# Patient Record
Sex: Male | Born: 1935
Health system: Southern US, Community
[De-identification: ages and names within clinical notes are randomized; demographics above are authoritative.]

## PROBLEM LIST (undated history)

## (undated) DIAGNOSIS — L409 Psoriasis, unspecified: Secondary | ICD-10-CM

## (undated) DIAGNOSIS — Z860100 Personal history of colon polyps, unspecified: Secondary | ICD-10-CM

## (undated) DIAGNOSIS — Z972 Presence of dental prosthetic device (complete) (partial): Secondary | ICD-10-CM

## (undated) DIAGNOSIS — M1990 Unspecified osteoarthritis, unspecified site: Secondary | ICD-10-CM

## (undated) DIAGNOSIS — E785 Hyperlipidemia, unspecified: Secondary | ICD-10-CM

## (undated) DIAGNOSIS — K5792 Diverticulitis of intestine, part unspecified, without perforation or abscess without bleeding: Secondary | ICD-10-CM

## (undated) HISTORY — DX: Diverticulitis of intestine, part unspecified, without perforation or abscess without bleeding: K57.92

## (undated) HISTORY — DX: Personal history of colonic polyps: Z86.010

## (undated) HISTORY — DX: Hyperlipidemia, unspecified: E78.5

## (undated) HISTORY — PX: SHOULDER SURGERY: SHX246

## (undated) HISTORY — PX: OTHER SURGICAL HISTORY: SHX169

## (undated) HISTORY — DX: Unspecified osteoarthritis, unspecified site: M19.90

## (undated) HISTORY — DX: Personal history of colon polyps, unspecified: Z86.0100

---

## 2016-05-12 ENCOUNTER — Ambulatory Visit: Payer: Medicare Other | Admitting: Internal Medicine

## 2016-05-26 ENCOUNTER — Ambulatory Visit (INDEPENDENT_AMBULATORY_CARE_PROVIDER_SITE_OTHER): Payer: Medicare Other | Admitting: Internal Medicine

## 2016-05-26 ENCOUNTER — Encounter: Payer: Self-pay | Admitting: Internal Medicine

## 2016-05-26 VITALS — BP 124/70 | HR 62 | Temp 97.4°F | Wt 191.8 lb

## 2016-05-26 DIAGNOSIS — K5730 Diverticulosis of large intestine without perforation or abscess without bleeding: Secondary | ICD-10-CM | POA: Diagnosis not present

## 2016-05-26 DIAGNOSIS — M1990 Unspecified osteoarthritis, unspecified site: Secondary | ICD-10-CM

## 2016-05-26 DIAGNOSIS — E7800 Pure hypercholesterolemia, unspecified: Secondary | ICD-10-CM | POA: Diagnosis not present

## 2016-05-26 DIAGNOSIS — L409 Psoriasis, unspecified: Secondary | ICD-10-CM | POA: Diagnosis not present

## 2016-05-26 NOTE — Assessment & Plan Note (Signed)
-  Continue Pravastatin ?

## 2016-05-26 NOTE — Assessment & Plan Note (Signed)
Continue Claritin and [id number]

## 2016-05-26 NOTE — Patient Instructions (Signed)
Fat and Cholesterol Restricted Diet Introduction Getting too much fat and cholesterol in your diet may cause health problems. Following this diet helps keep your fat and cholesterol at normal levels. This can keep you from getting sick. What types of fat should I choose?  Choose monosaturated and polyunsaturated fats. These are found in foods such as olive oil, canola oil, flaxseeds, walnuts, almonds, and seeds.  Eat more omega-3 fats. Good choices include salmon, mackerel, sardines, tuna, flaxseed oil, and ground flaxseeds.  Limit saturated fats. These are in animal products such as meats, butter, and cream. They can also be in plant products such as palm oil, palm kernel oil, and coconut oil.  Avoid foods with partially hydrogenated oils in them. These contain trans fats. Examples of foods that have trans fats are stick margarine, some tub margarines, cookies, crackers, and other baked goods. What general guidelines do I need to follow?  Check food labels. Look for the words "trans fat" and "saturated fat."  When preparing a meal:  Fill half of your plate with vegetables and green salads.  Fill one fourth of your plate with whole grains. Look for the word "whole" as the first word in the ingredient list.  Fill one fourth of your plate with lean protein foods.  Eat more foods that have fiber, like apples, carrots, beans, peas, and barley.  Eat more home-cooked foods. Eat less at restaurants and buffets.  Limit or avoid alcohol.  Limit foods high in starch and sugar.  Limit fried foods.  Cook foods without frying them. Baking, boiling, grilling, and broiling are all great options.  Lose weight if you are overweight. Losing even a small amount of weight can help your overall health. It can also help prevent diseases such as diabetes and heart disease. What foods can I eat? Grains  Whole grains, such as whole wheat or whole grain breads, crackers, cereals, and pasta. Unsweetened  oatmeal, bulgur, barley, quinoa, or brown rice. Corn or whole wheat flour tortillas. Vegetables  Fresh or frozen vegetables (raw, steamed, roasted, or grilled). Green salads. Fruits  All fresh, canned (in natural juice), or frozen fruits. Meat and Other Protein Products  Ground beef (85% or leaner), grass-fed beef, or beef trimmed of fat. Skinless chicken or turkey. Ground chicken or turkey. Pork trimmed of fat. All fish and seafood. Eggs. Dried beans, peas, or lentils. Unsalted nuts or seeds. Unsalted canned or dry beans. Dairy  Low-fat dairy products, such as skim or 1% milk, 2% or reduced-fat cheeses, low-fat ricotta or cottage cheese, or plain low-fat yogurt. Fats and Oils  Tub margarines without trans fats. Light or reduced-fat mayonnaise and salad dressings. Avocado. Olive, canola, sesame, or safflower oils. Natural peanut or almond butter (choose ones without added sugar and oil). The items listed above may not be a complete list of recommended foods or beverages. Contact your dietitian for more options.  What foods are not recommended? Grains  White bread. White pasta. White rice. Cornbread. Bagels, pastries, and croissants. Crackers that contain trans fat. Vegetables  White potatoes. Corn. Creamed or fried vegetables. Vegetables in a cheese sauce. Fruits  Dried fruits. Canned fruit in light or heavy syrup. Fruit juice. Meat and Other Protein Products  Fatty cuts of meat. Ribs, chicken wings, bacon, sausage, bologna, salami, chitterlings, fatback, hot dogs, bratwurst, and packaged luncheon meats. Liver and organ meats. Dairy  Whole or 2% milk, cream, half-and-half, and cream cheese. Whole milk cheeses. Whole-fat or sweetened yogurt. Full-fat cheeses. Nondairy creamers and   whipped toppings. Processed cheese, cheese spreads, or cheese curds. Sweets and Desserts  Corn syrup, sugars, honey, and molasses. Candy. Jam and jelly. Syrup. Sweetened cereals. Cookies, pies, cakes, donuts,  muffins, and ice cream. Fats and Oils  Butter, stick margarine, lard, shortening, ghee, or bacon fat. Coconut, palm kernel, or palm oils. Beverages  Alcohol. Sweetened drinks (such as sodas, lemonade, and fruit drinks or punches). The items listed above may not be a complete list of foods and beverages to avoid. Contact your dietitian for more information.  This information is not intended to replace advice given to you by your health care provider. Make sure you discuss any questions you have with your health care provider. Document Released: 09/29/2011 Document Revised: 12/05/2015 Document Reviewed: 06/29/2013  2017 Elsevier  

## 2016-05-26 NOTE — Assessment & Plan Note (Signed)
Advised him it is okay to take Aleve daily as needed Monitor for now

## 2016-05-26 NOTE — Assessment & Plan Note (Signed)
S/p resection Will monitor

## 2016-05-26 NOTE — Progress Notes (Signed)
HPI  Pt presents to the clinic today to establish care and for management of the conditions listed below.   HLD: He reports he just had his labs drawn 6 weeks ago. He is taking Pravastatin as prescribed. He denies myalgias. He does try to eat a low fat diet.  Arthritis: Mainly in his shoulders and low back pain. He takes Ibuprofen as needed with good relief.  Diverticulitis: s/p resection. His last colonoscopy showed no diverticulosis. He takes a Probiotic daily.  Psoriasis: He takes Claritin daily. He uses MG217 twice daily with good relief.   Flu: 12/2015 Tetanus: 2011 Pneumovax: 2016 Prevnar: unsure Zostavax: never Colon Screening: 2013 Vision Screening: annually 11/2015 Dentist:   Past Medical History:  Diagnosis Date  . Arthritis   . Diverticulitis   . History of colon polyps   . Hyperlipidemia     Current Outpatient Prescriptions  Medication Sig Dispense Refill  . loratadine (CLARITIN) 10 MG tablet Take 10 mg by mouth daily.    . pravastatin (PRAVACHOL) 20 MG tablet Take 20 mg by mouth daily.    . Probiotic Product (PROBIOTIC-10 PO) Take 1 tablet by mouth daily.     No current facility-administered medications for this visit.     No Known Allergies  Family History  Problem Relation Age of Onset  . Alzheimer's disease Father     Social History   Social History  . Marital status: Married    Spouse name: N/A  . Number of children: N/A  . Years of education: N/A   Occupational History  . Not on file.   Social History Main Topics  . Smoking status: Former Smoker    Types: Cigarettes  . Smokeless tobacco: Never Used     Comment: quit 2000  . Alcohol use Yes     Comment: occasional  . Drug use: Unknown  . Sexual activity: Not on file   Other Topics Concern  . Not on file   Social History Narrative  . No narrative on file    ROS:  Constitutional: Denies fever, malaise, fatigue, headache or abrupt weight changes.  HEENT: Denies eye pain, eye  redness, ear pain, ringing in the ears, wax buildup, runny nose, nasal congestion, bloody nose, or sore throat. Respiratory: Denies difficulty breathing, shortness of breath, cough or sputum production.   Cardiovascular: Denies chest pain, chest tightness, palpitations or swelling in the hands or feet.  Gastrointestinal: Denies abdominal pain, bloating, constipation, diarrhea or blood in the stool.  GU: Denies frequency, urgency, pain with urination, blood in urine, odor or discharge. Musculoskeletal: Pt reports intermittent joint pains. Denies decrease in range of motion, difficulty with gait, muscle pain or joint swelling.  Skin: Denies redness, rashes, lesions or ulcercations.  Neurological: Denies dizziness, difficulty with memory, difficulty with speech or problems with balance and coordination.  Psych: Denies anxiety, depression, SI/HI.  No other specific complaints in a complete review of systems (except as listed in HPI above).  PE:  BP 124/70   Pulse 62   Temp 97.4 F (36.3 C) (Oral)   Wt 191 lb 12 oz (87 kg)   SpO2 97%  Wt Readings from Last 3 Encounters:  05/26/16 191 lb 12 oz (87 kg)    General: Appears his stated age, well developed, well nourished in NAD. Cardiovascular: Normal rate and rhythm. S1,S2 noted.  No murmur, rubs or gallops noted.  Pulmonary/Chest: Normal effort and positive vesicular breath sounds. No respiratory distress. No wheezes, rales or ronchi noted.  Musculoskeletal:Strength  5/5 BUE/BLE. No signs of joint swelling. No difficulty with gait.  Neurological: Alert and oriented. Psychiatric: Mood and affect normal. Behavior is normal. Judgment and thought content normal.     Assessment and Plan:

## 2016-07-16 ENCOUNTER — Encounter (INDEPENDENT_AMBULATORY_CARE_PROVIDER_SITE_OTHER): Payer: Self-pay

## 2016-07-16 ENCOUNTER — Encounter: Payer: Self-pay | Admitting: Internal Medicine

## 2016-07-16 ENCOUNTER — Ambulatory Visit (INDEPENDENT_AMBULATORY_CARE_PROVIDER_SITE_OTHER): Payer: Medicare Other | Admitting: Internal Medicine

## 2016-07-16 VITALS — BP 126/68 | HR 67 | Temp 97.9°F | Wt 195.5 lb

## 2016-07-16 DIAGNOSIS — S81011A Laceration without foreign body, right knee, initial encounter: Secondary | ICD-10-CM

## 2016-07-16 DIAGNOSIS — E7800 Pure hypercholesterolemia, unspecified: Secondary | ICD-10-CM | POA: Diagnosis not present

## 2016-07-16 DIAGNOSIS — L409 Psoriasis, unspecified: Secondary | ICD-10-CM | POA: Diagnosis not present

## 2016-07-16 MED ORDER — PRAVASTATIN SODIUM 20 MG PO TABS: 20.0000 mg | ORAL_TABLET | Freq: Every day | ORAL | 5 refills | Status: DC

## 2016-07-16 MED ORDER — CLOBETASOL PROPIONATE 0.05 % EX OINT: 1.0000 "application " | TOPICAL_OINTMENT | Freq: Two times a day (BID) | CUTANEOUS | 0 refills | Status: DC

## 2016-07-16 NOTE — Progress Notes (Signed)
Subjective:    Patient ID: Lawrence Bush, male    DOB: 27-Aug-1933, 83 y.o.   MRN: [id number]  HPI  Pt presents to the clinic today to discuss his psoriasis. This has been an ongoing issue for him. He takes Claritin and MG217 2 x day which normally keeps it at [place]. He reports it has started to flare up on his legs over the last few months. He has not seen a dermatologist about this.  He also reports a laceration to his right knee. He reports he was gardening and he kneeled down on a pair of scissors. It cut his leg. He was able to stop the bleeding. He washed the wound with soap and warm water. He has put Neosporin over the area. He has not had any redness or swelling. He reports he had his last tetanus 3-4 years ago.  He is also requesting a refill of his Pravastatin today.  Review of Systems  Past Medical History:  Diagnosis Date  . Arthritis   . Diverticulitis   . History of colon polyps   . Hyperlipidemia     Current Outpatient Prescriptions  Medication Sig Dispense Refill  . Coal Tar Extract [phone] PSORIASIS MULTI-SYMPTOM) 2 % OINT Apply topically.    Marland Kitchen loratadine (CLARITIN) 10 MG tablet Take 10 mg by mouth daily.    . pravastatin (PRAVACHOL) 20 MG tablet Take 20 mg by mouth daily.    . Probiotic Product (PROBIOTIC-10 PO) Take 1 tablet by mouth daily.     No current facility-administered medications for this visit.     No Known Allergies  Family History  Problem Relation Age of Onset  . Alzheimer's disease Father     Social History   Social History  . Marital status: Married    Spouse name: N/A  . Number of children: N/A  . Years of education: N/A   Occupational History  . Not on file.   Social History Main Topics  . Smoking status: Former Smoker    Types: Cigarettes  . Smokeless tobacco: Never Used     Comment: quit 2000  . Alcohol use Yes     Comment: occasional  . Drug use: No  . Sexual activity: Yes   Other Topics Concern  . Not on file   Social  History Narrative  . No narrative on file     Constitutional: Denies fever, malaise, fatigue, headache or abrupt weight changes.  Skin: Pt reports psoriasis on elbows and lower legs and laceration to right knee. Denies lesions or ulcercations.    No other specific complaints in a complete review of systems (except as listed in HPI above).     Objective:   Physical Exam  BP 126/68   Pulse 67   Temp 97.9 F (36.6 C) (Oral)   Wt 195 lb 8 oz (88.7 kg)   SpO2 97%  Wt Readings from Last 3 Encounters:  07/16/16 195 lb 8 oz (88.7 kg)  05/26/16 191 lb 12 oz (87 kg)    General: Appears his stated age, well developed, well nourished in NAD. Skin: Scaly lesions noted on bilateral elbows and bilateral lower legs. .25 cm linear laceration noted to right medial knee. No surrounding redness or warmth.     Assessment & Plan:   Psoriasis:  eRx for Clobetasol Ointment BID  If no improvement, will refer to dermatology  Laceration of Knee:  No s/s of infection Continue Neosporin  HLD:  Pravastatin refilled today  RTC as  needed or if symptoms persist or worsen Lilianne Delair, NP

## 2016-07-16 NOTE — Patient Instructions (Signed)
Psoriasis Psoriasis is a long-term (chronic) skin condition. It causes raised, red patches (plaques) on your skin that look silvery. The red patches may show up anywhere on your body. They can be any size or shape. Psoriasis can come and go. It can range from mild to very bad. It cannot be passed from one person to another (not contagious). There is no cure for this condition, but it can be helped with treatment. Follow these instructions at home: Skin Care   Apply moisturizers to your skin as needed. Only use those that your doctor has said are okay.  Apply cool compresses to the affected areas.  Do not scratch your skin. Lifestyle    Do not use tobacco products. This includes cigarettes, chewing tobacco, and e-cigarettes. If you need help quitting, ask your doctor.  Drink little or no alcohol.  Try to lower your stress. Meditation or yoga may help.  Get sun as told by your doctor. Do not get sunburned.  Think about joining a psoriasis support group. Medicines   Take or use over-the-counter and prescription medicines only as told by your doctor.  If you were prescribed an antibiotic, take or use it as told by your doctor. Do not stop taking the antibiotic even if your condition starts to get better. General instructions   Keep a journal. Use this to help track what triggers an outbreak. Try to avoid any triggers.  See a counselor or social worker if you feel very sad, upset, or hopeless about your condition and these feelings affect your work or relationships.  Keep all follow-up visits as told by your doctor. This is important. Contact a doctor if:  Your pain gets worse.  You have more redness or warmth in the affected areas.  You have new pain or stiffness in your joints.  Your pain or stiffness in your joints gets worse.  Your nails start to break easily.  Your nails pull away from the nail bed easily.  You have a fever.  You feel very sad (depressed). This  information is not intended to replace advice given to you by your health care provider. Make sure you discuss any questions you have with your health care provider. Document Released: 05/07/2004 Document Revised: 09/05/2015 Document Reviewed: 08/15/2014 Elsevier Interactive Patient Education  2017 [employer].

## 2016-07-16 NOTE — Progress Notes (Signed)
Pre visit review using our clinic review tool, if applicable. No additional management support is needed unless otherwise documented below in the visit note. 

## 2016-08-06 ENCOUNTER — Other Ambulatory Visit: Payer: Self-pay

## 2016-08-06 MED ORDER — CLOBETASOL PROPIONATE 0.05 % EX OINT: 1.0000 "application " | TOPICAL_OINTMENT | Freq: Two times a day (BID) | CUTANEOUS | 0 refills | Status: DC

## 2016-08-06 MED ORDER — PRAVASTATIN SODIUM 20 MG PO TABS: 20.0000 mg | ORAL_TABLET | Freq: Every day | ORAL | 1 refills | Status: DC

## 2016-08-06 NOTE — Telephone Encounter (Signed)
Pt request 90 day rx for pravastatin (done) and clobetasol ointment (last refilled 30 g on  07/16/16)onto [facility].;pt has set up acct with [facility]. Pt said has about one wk left of ointment and request refill to [facility].Please advise.

## 2016-08-07 MED ORDER — PRAVASTATIN SODIUM 20 MG PO TABS: 20.0000 mg | ORAL_TABLET | Freq: Every day | ORAL | 1 refills | Status: DC

## 2016-08-07 MED ORDER — CLOBETASOL PROPIONATE 0.05 % EX OINT: 1.0000 "application " | TOPICAL_OINTMENT | Freq: Two times a day (BID) | CUTANEOUS | 0 refills | Status: DC

## 2016-08-07 NOTE — Addendum Note (Signed)
Addended by: Lurlean Nanny on: 08/07/2016 04:26 PM   Modules accepted: Orders

## 2016-08-13 ENCOUNTER — Telehealth: Payer: Self-pay

## 2016-08-13 NOTE — Telephone Encounter (Signed)
[facility] left another v.m requesting the supervising physicians name and info for Lawrence Echevaria NP when calling back with clarification of instructions for med.

## 2016-08-13 NOTE — Telephone Encounter (Signed)
See below

## 2016-08-13 NOTE — Telephone Encounter (Signed)
[facility] rx left v/m requesting clarification of instructions for clobetasol ointment; wants to know how may grams will be applied bid.

## 2016-08-13 NOTE — Telephone Encounter (Signed)
As much as it takes to cover the affected area. We never prescribe a # of grams. We only say 1 application to affected area BID.

## 2016-08-14 MED ORDER — PRAVASTATIN SODIUM 20 MG PO TABS: 20.0000 mg | ORAL_TABLET | Freq: Every day | ORAL | 1 refills | Status: DC

## 2016-08-14 NOTE — Addendum Note (Signed)
Addended by: Lurlean Nanny on: 08/14/2016 10:20 AM   Modules accepted: Orders

## 2016-08-14 NOTE — Telephone Encounter (Signed)
Spoke to mail order and they were given supervising physician info and verification of instructions for topical

## 2016-11-10 ENCOUNTER — Encounter: Payer: Self-pay | Admitting: Internal Medicine

## 2016-11-10 ENCOUNTER — Ambulatory Visit (INDEPENDENT_AMBULATORY_CARE_PROVIDER_SITE_OTHER): Payer: Medicare Other | Admitting: Internal Medicine

## 2016-11-10 VITALS — BP 124/78 | HR 70 | Temp 98.0°F | Ht 71.0 in | Wt 196.0 lb

## 2016-11-10 DIAGNOSIS — Z0000 Encounter for general adult medical examination without abnormal findings: Secondary | ICD-10-CM

## 2016-11-10 DIAGNOSIS — L409 Psoriasis, unspecified: Secondary | ICD-10-CM

## 2016-11-10 DIAGNOSIS — Z125 Encounter for screening for malignant neoplasm of prostate: Secondary | ICD-10-CM | POA: Diagnosis not present

## 2016-11-10 LAB — CBC
HCT: 39.6 % (ref 39.0–52.0)
HEMOGLOBIN: 13.3 g/dL (ref 13.0–17.0)
MCHC: 33.6 g/dL (ref 30.0–36.0)
MCV: 93.1 fl (ref 78.0–100.0)
PLATELETS: 245 10*3/uL (ref 150.0–400.0)
RBC: 4.26 Mil/uL (ref 4.22–5.81)
RDW: 13.6 % (ref 11.5–15.5)
WBC: 6.7 10*3/uL (ref 4.0–10.5)

## 2016-11-10 LAB — PSA, MEDICARE: PSA: 9.3 ng/mL — AB (ref 0.10–4.00)

## 2016-11-10 LAB — LIPID PANEL
Cholesterol: 214 mg/dL — ABNORMAL HIGH (ref 0–200)
HDL: 47.1 mg/dL (ref >39.00)
Total CHOL/HDL Ratio: 5

## 2016-11-10 LAB — COMPREHENSIVE METABOLIC PANEL
ALBUMIN: 4.3 g/dL (ref 3.5–5.2)
ALT: 21 U/L (ref 0–53)
AST: 25 U/L (ref 0–37)
Alkaline Phosphatase: 61 U/L (ref 39–117)
BILIRUBIN TOTAL: 0.6 mg/dL (ref 0.2–1.2)
BUN: 25 mg/dL — ABNORMAL HIGH (ref 6–23)
CALCIUM: 9.1 mg/dL (ref 8.4–10.5)
CHLORIDE: 104 meq/L (ref 96–112)
CO2: 29 meq/L (ref 19–32)
CREATININE: 1.62 mg/dL — AB (ref 0.40–1.50)
GFR: 43.67 mL/min — AB (ref >60.00)
Glucose, Bld: 119 mg/dL — ABNORMAL HIGH (ref 70–99)
Potassium: 4.7 mEq/L (ref 3.5–5.1)
Sodium: 139 mEq/L (ref 135–145)
Total Protein: 6.9 g/dL (ref 6.0–8.3)

## 2016-11-10 NOTE — Progress Notes (Signed)
HPI:  Pt presents to the clinic today for his Medicare Wellness Exam.  Past Medical History:  Diagnosis Date  . Arthritis   . Diverticulitis   . History of colon polyps   . Hyperlipidemia     Current Outpatient Prescriptions  Medication Sig Dispense Refill  . clobetasol ointment (TEMOVATE) 2.83 % Apply 1 application topically 2 (two) times daily. 30 g 0  . Coal Tar Extract [phone] PSORIASIS MULTI-SYMPTOM) 2 % OINT Apply topically.    Marland Kitchen loratadine (CLARITIN) 10 MG tablet Take 10 mg by mouth daily.    . pravastatin (PRAVACHOL) 20 MG tablet Take 1 tablet (20 mg total) by mouth daily. 90 tablet 1  . Probiotic Product (PROBIOTIC-10 PO) Take 1 tablet by mouth daily.     No current facility-administered medications for this visit.     No Known Allergies  Family History  Problem Relation Age of Onset  . Alzheimer's disease Father     Social History   Social History  . Marital status: Married    Spouse name: N/A  . Number of children: N/A  . Years of education: N/A   Occupational History  . Not on file.   Social History Main Topics  . Smoking status: Former Smoker    Types: Cigarettes  . Smokeless tobacco: Never Used     Comment: quit 2000  . Alcohol use Yes     Comment: occasional  . Drug use: No  . Sexual activity: Yes   Other Topics Concern  . Not on file   Social History Narrative  . No narrative on file    Hospitiliaztions: None  Health Maintenance:     Flu: 12/2015  Tetanus: 2011  Pneumovax: 2016  Prevnar: he has had this but he can not remember when  Zostavax: never  PSA: unsure  Colon Screening: 2013  Eye Doctor: annually, 11/2015  Dental Exam:   Providers:   PCP: Webb Silversmith, NP-C   I have personally reviewed and have noted:  1. The patient's medical and social history 2. Their use of alcohol, tobacco or illicit drugs 3. Their current medications and supplements 4. The patient's functional ability including ADL's, fall risks, home safety  risks and hearing or visual impairment. 5. Diet and physical activities 6. Evidence for depression or mood disorder  Subjective:   Review of Systems:   Constitutional: Denies fever, malaise, fatigue, headache or abrupt weight changes.  HEENT: Denies eye pain, eye redness, ear pain, ringing in the ears, wax buildup, runny nose, nasal congestion, bloody nose, or sore throat. Respiratory: Denies difficulty breathing, shortness of breath, cough or sputum production.   Cardiovascular: Denies chest pain, chest tightness, palpitations or swelling in the hands or feet.  Gastrointestinal: Denies abdominal pain, bloating, constipation, diarrhea or blood in the stool.  GU: Denies urgency, frequency, pain with urination, burning sensation, blood in urine, odor or discharge. Musculoskeletal: Denies decrease in range of motion, difficulty with gait, muscle pain or joint pain and swelling.  Skin: Pt reports psoriasis. Denies redness or ulcercations.  Neurological: Denies dizziness, difficulty with memory, difficulty with speech or problems with balance and coordination.  Psych: Denies anxiety, depression, SI/HI.  No other specific complaints in a complete review of systems (except as listed in HPI above).  Objective:  PE:   BP 124/78   Pulse 70   Temp 98 F (36.7 C) (Oral)   Ht 5\' 11"  (1.803 m)   Wt 196 lb (88.9 kg)   SpO2 96%  BMI 27.34 kg/m   Wt Readings from Last 3 Encounters:  07/16/16 195 lb 8 oz (88.7 kg)  05/26/16 191 lb 12 oz (87 kg)    General: Appears his stated age, in NAD. Skin: Warm, dry and intact. Psoriasis noted on BLE. HEENT: Head: normal shape and size; Eyes: sclera white, no icterus, conjunctiva pink, PERRLA and EOMs intact; Ears: Tm's gray and intact, normal light reflex; Throat/Mouth: Teeth present, mucosa pink and moist, no exudate, lesions or ulcerations noted.  Neck: Neck supple, trachea midline. No masses, lumps or thyromegaly present.  Cardiovascular: Normal  rate and rhythm. S1,S2 noted.  No murmur, rubs or gallops noted. No JVD or BLE edema. No carotid bruits noted. Pulmonary/Chest: Normal effort and positive vesicular breath sounds. No respiratory distress. No wheezes, rales or ronchi noted.  Abdomen: Soft and nontender. Normal bowel sounds. No distention or masses noted. Liver, spleen and kidneys non palpable. Musculoskeletal: Strength 5/5 BUE. Strength 4/5 LLE, 5/5 RLE. No difficulty with gait.  Neurological: Alert and oriented. Cranial nerves II-XII grossly intact. Coordination normal.  Psychiatric: Mood and affect normal. Behavior is normal. Judgment and thought content normal.     Assessment and Plan:   Medicare Annual Wellness Visit:  Diet: He does eat meat. He consumes more fruits than veggies. He occasionally eats fried foods. He drinks mostly soda, water, and beer at night.  Physical activity: He walks for 60 minutes daily. Depression/mood screen: Negative Hearing: Trouble hearing in a crowded room. Visual acuity: Grossly normal, performs annual eye exam  ADLs: Capable Fall risk: None Home safety: Good Cognitive evaluation: Intact to orientation, naming, recall and repetition EOL planning: Adv directives, full code/ I agree  Preventative Medicine: Encouraged him to get a flu shot in the fall. Tetanus UTD. Pneumovax and prevnar UTD. He declines shingles vaccination. Colon screening UTD. Encouraged him to consume a balanced diet and exercise regimen. Advised him to see an eye doctor and dentist annually. Will check CBC, CMET, Lipid, and PSA today.  Psoriasis:  Referral to dermatology placed  Next appointment: 1 year, Medicare Wellness Exam    Webb Silversmith, NP

## 2016-11-10 NOTE — Patient Instructions (Signed)

## 2016-11-11 DIAGNOSIS — L400 Psoriasis vulgaris: Secondary | ICD-10-CM | POA: Diagnosis not present

## 2016-11-11 LAB — LDL CHOLESTEROL, DIRECT: LDL DIRECT: 110 mg/dL

## 2016-11-17 ENCOUNTER — Other Ambulatory Visit: Payer: Self-pay | Admitting: Internal Medicine

## 2016-11-17 DIAGNOSIS — E7800 Pure hypercholesterolemia, unspecified: Secondary | ICD-10-CM

## 2016-11-17 DIAGNOSIS — R972 Elevated prostate specific antigen [PSA]: Secondary | ICD-10-CM

## 2016-11-20 ENCOUNTER — Encounter: Payer: Self-pay | Admitting: Family Medicine

## 2016-11-20 ENCOUNTER — Ambulatory Visit (INDEPENDENT_AMBULATORY_CARE_PROVIDER_SITE_OTHER): Payer: Medicare Other | Admitting: Family Medicine

## 2016-11-20 ENCOUNTER — Other Ambulatory Visit (INDEPENDENT_AMBULATORY_CARE_PROVIDER_SITE_OTHER): Payer: Medicare Other

## 2016-11-20 VITALS — BP 150/62 | HR 70 | Temp 97.4°F | Wt 196.2 lb

## 2016-11-20 DIAGNOSIS — E7800 Pure hypercholesterolemia, unspecified: Secondary | ICD-10-CM | POA: Diagnosis not present

## 2016-11-20 DIAGNOSIS — R351 Nocturia: Secondary | ICD-10-CM | POA: Diagnosis not present

## 2016-11-20 DIAGNOSIS — R972 Elevated prostate specific antigen [PSA]: Secondary | ICD-10-CM | POA: Diagnosis not present

## 2016-11-20 LAB — POC URINALSYSI DIPSTICK (AUTOMATED)
Bilirubin, UA: NEGATIVE
Blood, UA: NEGATIVE
Glucose, UA: NEGATIVE
KETONES UA: NEGATIVE
LEUKOCYTES UA: NEGATIVE
Nitrite, UA: NEGATIVE
PROTEIN UA: NEGATIVE
Spec Grav, UA: >=1.03 — AB (ref 1.010–1.025)
Urobilinogen, UA: 0.2 E.U./dL
pH, UA: 6 (ref 5.0–8.0)

## 2016-11-20 MED ORDER — TAMSULOSIN HCL 0.4 MG PO CAPS: 0.4000 mg | ORAL_CAPSULE | Freq: Every day | ORAL | 0 refills | Status: DC

## 2016-11-20 NOTE — Progress Notes (Signed)
Repeat PSA pending.  Prev PSAs per patient were usually 2.5 and 4.  Not on avodart.  More recently with elevated PSA noted.  He has urinary sx.  Started about 90 days ago.  Frequency.  Nocturia.   Some discomfort at the end of urination.  No blood in urine. No FCANVD.    He is not fasting so we did not recheck his lipids today. D/w pt.    Meds, vitals, and allergies reviewed.   ROS: Per HPI unless specifically indicated in ROS section   nad ncat rrr ctab abd soft, not ttp DRE unremarkable- prostate not significantly enlarged. Prostate is symmetric. Not tender. No gross blood on exam.

## 2016-11-20 NOTE — Addendum Note (Signed)
Addended by: Marchia Bond on: 11/20/2016 04:04 PM   Modules accepted: Orders

## 2016-11-20 NOTE — Patient Instructions (Signed)
Recheck PSA today- only PSA since you are not fasting.  Urine culture is pending.  If you have more urinary symptoms, then start flomax.  We'll be in touch.  Take care.  Glad to see you.

## 2016-11-21 LAB — URINE CULTURE: Organism ID, Bacteria: NO GROWTH

## 2016-11-21 LAB — PSA: PSA: 4.7 ng/mL — ABNORMAL HIGH (ref <=4.0)

## 2016-11-22 DIAGNOSIS — R351 Nocturia: Secondary | ICD-10-CM | POA: Insufficient documentation

## 2016-11-22 NOTE — Assessment & Plan Note (Signed)
Several potentially separate issues discussed with patient. He could have an acute issue such as a UTI. Reasonable to check urine culture. He could have benign prostate enlargement that has gradually caused a worsening of symptoms. It is possible that he could have prostate cancer, given that he had an elevated PSA on most recent check. Discussed with patient that he could have had a false elevation in his PSA. At this point, urine culture is pending.  We did not start antibiotics at this point. If more urinary symptoms, then start flomax.  I'll discuss with PCP about PSA results. See notes on labs. Routed to PCP.

## 2016-11-23 ENCOUNTER — Other Ambulatory Visit: Payer: Self-pay | Admitting: Internal Medicine

## 2016-11-23 NOTE — Progress Notes (Signed)
Call pt:  PSA has improved but still a little higher than previous. He has 2 options. 1- we can monitor and recheck in 1 year. If he has worsening nocturia, we can start the Flomax at any time. 2- we can go ahead and send him to urology for further evaluation. Let me know what he prefers.

## 2016-11-24 ENCOUNTER — Telehealth: Payer: Self-pay

## 2016-11-24 NOTE — Telephone Encounter (Signed)
Pt is aware of results as instructed... Pt does not want to see urology as of now and has the Rx for Flomax from Dr Damita Dunnings.... Pt will let us know if anything changes

## 2016-11-24 NOTE — Telephone Encounter (Signed)
Per [place]   Call pt:  PSA has improved but still a little higher than previous. He has 2 options. 1- we can monitor and recheck in 1 year. If he has worsening nocturia, we can start the Flomax at any time. 2- we can go ahead and send him to urology for further evaluation. Let me know what he prefers.

## 2016-12-23 DIAGNOSIS — L400 Psoriasis vulgaris: Secondary | ICD-10-CM | POA: Diagnosis not present

## 2016-12-25 ENCOUNTER — Other Ambulatory Visit: Payer: Self-pay

## 2016-12-25 MED ORDER — PRAVASTATIN SODIUM 20 MG PO TABS: 20.0000 mg | ORAL_TABLET | Freq: Every day | ORAL | 0 refills | Status: DC

## 2017-03-10 DIAGNOSIS — L400 Psoriasis vulgaris: Secondary | ICD-10-CM | POA: Diagnosis not present

## 2017-05-13 ENCOUNTER — Ambulatory Visit (INDEPENDENT_AMBULATORY_CARE_PROVIDER_SITE_OTHER): Payer: Medicare HMO | Admitting: Internal Medicine

## 2017-05-13 ENCOUNTER — Encounter: Payer: Self-pay | Admitting: Internal Medicine

## 2017-05-13 VITALS — BP 126/78 | HR 60 | Temp 98.1°F | Wt 198.0 lb

## 2017-05-13 DIAGNOSIS — M19042 Primary osteoarthritis, left hand: Secondary | ICD-10-CM

## 2017-05-13 DIAGNOSIS — R197 Diarrhea, unspecified: Secondary | ICD-10-CM | POA: Diagnosis not present

## 2017-05-13 NOTE — Patient Instructions (Signed)
Diarrhea, Adult °Diarrhea is when you have loose and water poop (stool) often. Diarrhea can make you feel weak and cause you to get dehydrated. Dehydration can make you tired and thirsty, make you have a dry mouth, and make it so you pee (urinate) less often. Diarrhea often lasts 2-3 days. However, it can last longer if it is a sign of something more serious. It is important to treat your diarrhea as told by your doctor. °Follow these instructions at home: °Eating and drinking ° °Follow these recommendations as told by your doctor: °· Take an oral rehydration solution (ORS). This is a drink that is sold at pharmacies and stores. °· Drink clear fluids, such as: °? Water. °? Ice chips. °? Diluted fruit juice. °? Low-calorie sports drinks. °· Eat bland, easy-to-digest foods in small amounts as you are able. These foods include: °? Bananas. °? Applesauce. °? Rice. °? Low-fat (lean) meats. °? Toast. °? Crackers. °· Avoid drinking fluids that have a lot of sugar or caffeine in them. °· Avoid alcohol. °· Avoid spicy or fatty foods. ° °General instructions ° °· Drink enough fluid to keep your pee (urine) clear or pale yellow. °· Wash your hands often. If you cannot use soap and water, use hand sanitizer. °· Make sure that all people in your home wash their hands well and often. °· Take over-the-counter and prescription medicines only as told by your doctor. °· Rest at home while you get better. °· Watch your condition for any changes. °· Take a warm bath to help with any burning or pain from having diarrhea. °· Keep all follow-up visits as told by your doctor. This is important. °Contact a doctor if: °· You have a fever. °· Your diarrhea gets worse. °· You have new symptoms. °· You cannot keep fluids down. °· You feel light-headed or dizzy. °· You have a headache. °· You have muscle cramps. °Get help right away if: °· You have chest pain. °· You feel very weak or you pass out (faint). °· You have bloody or black poop or  poop that look like tar. °· You have very bad pain, cramping, or bloating in your belly (abdomen). °· You have trouble breathing or you are breathing very quickly. °· Your heart is beating very quickly. °· Your skin feels cold and clammy. °· You feel confused. °· You have signs of dehydration, such as: °? Dark pee, hardly any pee, or no pee. °? Cracked lips. °? Dry mouth. °? Sunken eyes. °? Sleepiness. °? Weakness. °This information is not intended to replace advice given to you by your health care provider. Make sure you discuss any questions you have with your health care provider. °Document Released: 09/16/2007 Document Revised: 10/18/2015 Document Reviewed: 12/04/2014 °Elsevier Interactive Patient Education © 2018 Elsevier Inc. ° °

## 2017-05-13 NOTE — Progress Notes (Signed)
Subjective:    Patient ID: Lawrence Bush, male    DOB: December 29, 1933, 83 y.o.   MRN: [id number]  HPI  Pt presents to the clinic today with c/o diarrhea. This started 1 week ago. He reports it started with abdominal cramping and gurgling. His stools are loose and watery, normal in color. He denies blood in his stool. He denies nausea or vomiting. He denies recent travel, changes in diet or medication. He has not been on abx recently. He has tried [employer] and a Probiotic with minimal relief. He has been consuming a bland diet. He has noticed some relief of his symptoms in the last 24 hours. He does have diverticulosis but reports he did have surgery to remove this. His last colonoscopy was 3-4 years ago. He has not had sick contacts.  He is also c/o pain and stiffness in the index finger, left hand. He has trouble bending it and straightening it. He describes the pain as sore and achy. It bothers him mostly when he is trying to do things with his hand. He has not taken anything OTC for this.  Review of Systems      Past Medical History:  Diagnosis Date  . Arthritis   . Diverticulitis   . History of colon polyps   . Hyperlipidemia     Current Outpatient Medications  Medication Sig Dispense Refill  . [employer] Extract [phone] PSORIASIS MULTI-SYMPTOM) 2 % OINT Apply topically.    Marland Kitchen loratadine (CLARITIN) 10 MG tablet Take 10 mg by mouth daily.    . pravastatin (PRAVACHOL) 20 MG tablet Take 1 tablet (20 mg total) by mouth daily. 90 tablet 0  . Probiotic Product (PROBIOTIC-10 PO) Take 1 tablet by mouth daily.    . tamsulosin (FLOMAX) 0.4 MG CAPS capsule Take 1 capsule (0.4 mg total) by mouth daily. 30 capsule 0   No current facility-administered medications for this visit.     No Known Allergies  Family History  Problem Relation Age of Onset  . Alzheimer's disease Father     Social History   Socioeconomic History  . Marital status: Married    Spouse name: Not on file  . Number of  children: Not on file  . Years of education: Not on file  . Highest education level: Not on file  Social Needs  . Financial resource strain: Not on file  . Food insecurity - worry: Not on file  . Food insecurity - inability: Not on file  . Transportation needs - medical: Not on file  . Transportation needs - non-medical: Not on file  Occupational History  . Not on file  Tobacco Use  . Smoking status: Former Smoker    Types: Cigarettes  . Smokeless tobacco: Never Used  . Tobacco comment: quit 2000  Substance and Sexual Activity  . Alcohol use: Yes    Comment: occasional  . Drug use: No  . Sexual activity: Yes  Other Topics Concern  . Not on file  Social History Narrative  . Not on file     Constitutional: Denies fever, malaise, fatigue, headache or abrupt weight changes.   Gastrointestinal: Pt reports diarrhea. Denies abdominal pain, bloating, constipation, or blood in the stool.  MSK: Pt reports pain and stiffness in index finger, left hand. Denies muscle pain.  No other specific complaints in a complete review of systems (except as listed in HPI above).  Objective:   Physical Exam  BP 126/78   Pulse 60   Temp  98.1 F (36.7 C) (Oral)   Wt 198 lb (89.8 kg)   SpO2 97%   BMI 27.62 kg/m   Wt Readings from Last 3 Encounters:  05/13/17 198 lb (89.8 kg)  11/20/16 196 lb 4 oz (89 kg)  11/10/16 196 lb (88.9 kg)    General: Appears his stated age, well developed, well nourished in NAD. Abdomen: Soft and nontender. Normal bowel sounds. No distention or masses noted.  MSK: Joint enlargement noted of the DIP left index finger. Pain with palpation of the joint. Decreased flexion and extension of the left DIP.  BMET    Component Value Date/Time   NA 139 11/10/2016 1454   K 4.7 11/10/2016 1454   CL 104 11/10/2016 1454   CO2 29 11/10/2016 1454   GLUCOSE 119 (H) 11/10/2016 1454   BUN 25 (H) 11/10/2016 1454   CREATININE 1.62 (H) 11/10/2016 1454   CALCIUM 9.1 11/10/2016  1454    Lipid Panel     Component Value Date/Time   CHOL 214 (H) 11/10/2016 1454   TRIG (H) 11/10/2016 1454    465.0 Triglyceride is over 400; calculations on Lipids are invalid.   HDL 47.10 11/10/2016 1454   CHOLHDL 5 11/10/2016 1454    CBC    Component Value Date/Time   WBC 6.7 11/10/2016 1454   RBC 4.26 11/10/2016 1454   HGB 13.3 11/10/2016 1454   HCT 39.6 11/10/2016 1454   PLT 245.0 11/10/2016 1454   MCV 93.1 11/10/2016 1454   MCHC 33.6 11/10/2016 1454   RDW 13.6 11/10/2016 1454    Hgb A1C No results found for: HGBA1C         Assessment & Plan:   Diarrhea:  Probably not infectious He declines stool cultutre or C Diff today CBC and CMET today He reports his last colonoscopy did not show any diverticulosis, so suspicion for diverticulitis is low Advised him to continue bland diet Can try Imodium OTC If persist, will get stool sample for testing  OA, First Finger, Left Hand:  Encouraged him to stay active Can try Aleve OTC He is asking about steroid injection into the joint, advised him I would ask Dr. Lorelei Pont about this and get back with him  Return precautions discussed Webb Silversmith, NP

## 2017-05-15 LAB — COMPREHENSIVE METABOLIC PANEL
A/G RATIO: 1.7 (ref 1.2–2.2)
ALT: 24 IU/L (ref 0–44)
AST: 26 IU/L (ref 0–40)
Albumin: 4.5 g/dL (ref 3.5–4.7)
Alkaline Phosphatase: 57 IU/L (ref 39–117)
BILIRUBIN TOTAL: 0.6 mg/dL (ref 0.0–1.2)
BUN/Creatinine Ratio: 12 (ref 10–24)
BUN: 14 mg/dL (ref 8–27)
CHLORIDE: 105 mmol/L (ref 96–106)
CO2: 25 mmol/L (ref 20–29)
Calcium: 8.9 mg/dL (ref 8.6–10.2)
Creatinine, Ser: 1.18 mg/dL (ref 0.76–1.27)
GFR calc non Af Amer: 58 mL/min/{1.73_m2} — ABNORMAL LOW (ref >59)
GFR, EST AFRICAN AMERICAN: 67 mL/min/{1.73_m2} (ref >59)
Globulin, Total: 2.6 g/dL (ref 1.5–4.5)
Glucose: 102 mg/dL — ABNORMAL HIGH (ref 65–99)
POTASSIUM: 4.4 mmol/L (ref 3.5–5.2)
Sodium: 143 mmol/L (ref 134–144)
TOTAL PROTEIN: 7.1 g/dL (ref 6.0–8.5)

## 2017-05-15 LAB — CBC
Hematocrit: 41.4 % (ref 37.5–51.0)
Hemoglobin: 14 g/dL (ref 13.0–17.7)
MCH: 30.9 pg (ref 26.6–33.0)
MCHC: 33.8 g/dL (ref 31.5–35.7)
MCV: 91 fL (ref 79–97)
PLATELETS: 286 10*3/uL (ref 150–379)
RBC: 4.53 x10E6/uL (ref 4.14–5.80)
RDW: 13.6 % (ref 12.3–15.4)
WBC: 6.1 10*3/uL (ref 3.4–10.8)

## 2017-05-20 ENCOUNTER — Telehealth: Payer: Self-pay | Admitting: *Deleted

## 2017-05-20 ENCOUNTER — Other Ambulatory Visit: Payer: Self-pay | Admitting: Internal Medicine

## 2017-05-20 DIAGNOSIS — R197 Diarrhea, unspecified: Secondary | ICD-10-CM

## 2017-05-20 NOTE — Telephone Encounter (Signed)
I can put in GI referral but we need him to provide a stool sample for testing. I will order the labs. He can either come here to provide the stool sample or come pick up the kit to do it a home and bring it back.

## 2017-05-20 NOTE — Telephone Encounter (Signed)
Pt is aware and states he will come to the office to pick up containers for stool tests

## 2017-05-20 NOTE — Telephone Encounter (Signed)
Copied from [place]. Topic: Referral - Request >> May 20, 2017 12:34 PM Margot Ables wrote: Reason for CRM: requesting referral to GI, ongoing stomach issues 2 1/2 weeks, pt has followed provider advice and not showing improvement   Last OV 04/2017-acute for diarrhea

## 2017-05-21 ENCOUNTER — Telehealth: Payer: Self-pay | Admitting: Internal Medicine

## 2017-05-21 DIAGNOSIS — R197 Diarrhea, unspecified: Secondary | ICD-10-CM | POA: Diagnosis not present

## 2017-05-21 NOTE — Addendum Note (Signed)
Addended by: Ellamae Sia on: 05/21/2017 11:34 AM   Modules accepted: Orders

## 2017-05-21 NOTE — Telephone Encounter (Signed)
Pt came in to drop off specimen at lab and wanted to speak to [facility] about the referral to the GI specialist. I told him that we couldn't schedule that until a referral was put in. He said that he prefers to go to a GI doctor at [facility].

## 2017-05-21 NOTE — Telephone Encounter (Signed)
Referral has been placed. 

## 2017-05-22 LAB — C. DIFFICILE GDH AND TOXIN A/B
GDH ANTIGEN: NOT DETECTED
MICRO NUMBER:: 90174399
SPECIMEN QUALITY:: ADEQUATE
TOXIN A AND B: NOT DETECTED

## 2017-05-25 LAB — STOOL CULTURE
MICRO NUMBER: 90174409
MICRO NUMBER:: 90174407
MICRO NUMBER:: 90174408
SHIGA RESULT: NOT DETECTED
SPECIMEN QUALITY: ADEQUATE
SPECIMEN QUALITY: ADEQUATE
SPECIMEN QUALITY:: ADEQUATE

## 2017-06-01 ENCOUNTER — Ambulatory Visit: Payer: Medicare HMO | Admitting: Gastroenterology

## 2017-06-01 ENCOUNTER — Encounter: Payer: Self-pay | Admitting: Gastroenterology

## 2017-06-01 VITALS — BP 161/83 | Temp 98.2°F | Ht 71.0 in | Wt 198.4 lb

## 2017-06-01 DIAGNOSIS — R197 Diarrhea, unspecified: Secondary | ICD-10-CM

## 2017-06-01 NOTE — Patient Instructions (Signed)
Thank you for choosing [facility] for your GI care.  Today's care was provided by Dr. Bonna Gains.  During your office visit with [place], Dr. Bonna Gains has advised the following: 1.  Follow up with [place] in 2 months. 2. Report to [facility] for labs. (GI Panel Stool Test)   Have a good day! Staff at [place]

## 2017-06-02 ENCOUNTER — Other Ambulatory Visit: Payer: Self-pay | Admitting: Gastroenterology

## 2017-06-02 ENCOUNTER — Other Ambulatory Visit: Payer: Self-pay

## 2017-06-02 DIAGNOSIS — Z8719 Personal history of other diseases of the digestive system: Secondary | ICD-10-CM | POA: Diagnosis not present

## 2017-06-02 DIAGNOSIS — Z9049 Acquired absence of other specified parts of digestive tract: Secondary | ICD-10-CM | POA: Diagnosis not present

## 2017-06-02 DIAGNOSIS — R197 Diarrhea, unspecified: Secondary | ICD-10-CM

## 2017-06-02 NOTE — Progress Notes (Signed)
Lawrence Bush [address]  [place]  [place], [place] [address]  Main: [phone]  Fax: [phone]   Gastroenterology Consultation  Referring Provider:     Jearld Fenton, NP Primary Care Physician:  Jearld Fenton, NP Primary Gastroenterologist:  Dr. Vonda Bush Reason for Consultation:     Diarrhea        HPI:   Lawrence Bush is a 84 y.o. y/o male referred for consultation & management  by Dr. Garnette Gunner, Coralie Keens, NP.  Pt. Reports one month history of diarrhea. Started with 10 lose BMs a day, daily since a month.  He was recently seen by his primary care provider who did C. difficile testing and stool culture which was negative.  Patient has been taking Imodium for the last 2 weeks.  He currently is having 2-3 "applesauce-like" bowel movements a day.  No blood in stool.  No weight loss.  No abdominal pain.  No nausea vomiting.  No dysphagia.  No heartburn.  No changes in medications.  No recent travel.  No sick contacts.  No fever or chills.  Patient has had history of partial colon resection due to recurrent diverticulitis in the 90s.  Since then he has not had any diarrhea, altered bowel habits, abdominal pain or discomfort.  Patient has had a colonoscopy within the last 5 years at an outside facility.  This noted a normal colon.  No diverticulosis was reported on that colonoscopy report.  Past Medical History:  Diagnosis Date  . Arthritis   . Diverticulitis   . History of colon polyps   . Hyperlipidemia     Past Surgical History:  Procedure Laterality Date  . SHOULDER SURGERY Left     Prior to Admission medications   Medication Sig Start Date End Date Taking? Authorizing Provider  calcipotriene-betamethasone Donita Brooks) ointment  05/06/17   [provider]  [employer] Extract [phone] PSORIASIS MULTI-SYMPTOM) 2 % OINT Apply topically.    [provider]  loratadine (CLARITIN) 10 MG tablet Take 10 mg by mouth daily.    [provider]  pravastatin (PRAVACHOL) 20 MG tablet Take 1 tablet (20 mg total) by mouth daily. 12/25/16   Jearld Fenton, NP  Probiotic Product (PROBIOTIC-10 PO) Take 1 tablet by mouth daily.    [provider]  tamsulosin (FLOMAX) 0.4 MG CAPS capsule Take 1 capsule (0.4 mg total) by mouth daily. 11/20/16   Tonia Ghent, MD    Family History  Problem Relation Age of Onset  . Alzheimer's disease Father      Social History   Tobacco Use  . Smoking status: Former Smoker    Types: Cigarettes  . Smokeless tobacco: Never Used  . Tobacco comment: quit 2000  Substance Use Topics  . Alcohol use: Yes    Comment: occasional  . Drug use: No    Allergies as of 06/01/2017 - Review Complete 05/13/2017  Allergen Reaction Noted  . No known allergies  11/11/2016    Review of Systems:    All systems reviewed and negative except where noted in HPI.   Physical Exam:  BP (!) 161/83   Temp 98.2 F (36.8 C) (Oral)   Ht 5\' 11"  (1.803 m)   Wt 198 lb 6.4 oz (90 kg)   BMI 27.67 kg/m  No LMP for male patient. Psych:  Alert and cooperative. Normal mood and affect. General:   Alert,  Well-developed, well-nourished, pleasant and cooperative in NAD Head:  Normocephalic and  atraumatic. Eyes:  Sclera clear, no icterus.   Conjunctiva pink. Ears:  Normal auditory acuity. Nose:  No deformity, discharge, or lesions. Mouth:  No deformity or lesions,oropharynx pink & moist. Neck:  Supple; no masses or thyromegaly. Lungs:  Respirations even and unlabored.  Clear throughout to auscultation.   No wheezes, crackles, or rhonchi. No acute distress. Heart:  Regular rate and rhythm; no murmurs, clicks, rubs, or gallops. Abdomen:  Normal bowel sounds.  No bruits.  Soft, non-tender and non-distended without masses, hepatosplenomegaly or hernias noted.  No guarding or rebound tenderness.    Msk:  Symmetrical without gross deformities. Good, equal movement & strength bilaterally. Pulses:  Normal pulses  noted. Extremities:  No clubbing or edema.  No cyanosis. Neurologic:  Alert and oriented x3;  grossly normal neurologically. Skin:  Intact without significant lesions or rashes. No jaundice. Lymph Nodes:  No significant cervical adenopathy. Psych:  Alert and cooperative. Normal mood and affect.   Labs: CBC    Component Value Date/Time   WBC 6.1 05/13/2017 0831   WBC 6.7 11/10/2016 1454   RBC 4.53 05/13/2017 0831   RBC 4.26 11/10/2016 1454   HGB 14.0 05/13/2017 0831   HCT 41.4 05/13/2017 0831   PLT 286 05/13/2017 0831   MCV 91 05/13/2017 0831   MCH 30.9 05/13/2017 0831   MCHC 33.8 05/13/2017 0831   MCHC 33.6 11/10/2016 1454   RDW 13.6 05/13/2017 0831   CMP     Component Value Date/Time   NA 143 05/13/2017 0831   K 4.4 05/13/2017 0831   CL 105 05/13/2017 0831   CO2 25 05/13/2017 0831   GLUCOSE 102 (H) 05/13/2017 0831   GLUCOSE 119 (H) 11/10/2016 1454   BUN 14 05/13/2017 0831   CREATININE 1.18 05/13/2017 0831   CALCIUM 8.9 05/13/2017 0831   PROT 7.1 05/13/2017 0831   ALBUMIN 4.5 05/13/2017 0831   AST 26 05/13/2017 0831   ALT 24 05/13/2017 0831   ALKPHOS 57 05/13/2017 0831   BILITOT 0.6 05/13/2017 0831   GFRNONAA 58 (L) 05/13/2017 0831   GFRAA 67 05/13/2017 0831    Imaging Studies: No results found.  Assessment and Plan:   Lawrence Bush is a 84 y.o. y/o male has been referred for 1 month history of diarrhea, with improvement in symptoms over the last 1-2 weeks with Imodium, with C. difficile and stool cultures negative on May 21, 2017, with no alarm symptoms present  Patient symptoms may be related to infection Stool cultures tested for Campylobacter, Salmonella and Shigella but no other organisms We will obtain GI panel by PCR to evaluate for other organisms including parasites and viruses Improvement in symptoms over the last 1-2 weeks is reassuring Educated on hydrating well and good nutrition Educated on good hygiene as well, washing food well before  cooking and eating If symptoms continue to improve, no need for further investigation If symptoms do not improve, can consider CT scan in the future. No alarm symptoms present at this time to indicate urgent endoscopy.  Dr Lawrence Bush

## 2017-06-04 LAB — GI PROFILE, STOOL, PCR
ASTROVIRUS: NOT DETECTED
Adenovirus F 40/41: NOT DETECTED
C difficile toxin A/B: NOT DETECTED
CRYPTOSPORIDIUM: NOT DETECTED
CYCLOSPORA CAYETANENSIS: NOT DETECTED
Campylobacter: NOT DETECTED
ENTAMOEBA HISTOLYTICA: NOT DETECTED
Enteroaggregative E coli: NOT DETECTED
Enteropathogenic E coli: NOT DETECTED
Enterotoxigenic E coli: NOT DETECTED
Giardia lamblia: NOT DETECTED
Norovirus GI/GII: NOT DETECTED
PLESIOMONAS SHIGELLOIDES: NOT DETECTED
ROTAVIRUS A: NOT DETECTED
SALMONELLA: NOT DETECTED
SAPOVIRUS: NOT DETECTED
SHIGA-TOXIN-PRODUCING E COLI: NOT DETECTED
Shigella/Enteroinvasive E coli: NOT DETECTED
VIBRIO CHOLERAE: NOT DETECTED
VIBRIO: NOT DETECTED
Yersinia enterocolitica: NOT DETECTED

## 2017-06-04 LAB — FECAL LACTOFERRIN, QUANT: Lactoferrin, Fecal, Quant.: 4.55 ug/mL(g) (ref 0.00–7.24)

## 2017-06-04 LAB — CALPROTECTIN, FECAL: Calprotectin, Fecal: 28 ug/g (ref 0–120)

## 2017-06-10 ENCOUNTER — Telehealth: Payer: Self-pay | Admitting: Gastroenterology

## 2017-06-10 NOTE — Telephone Encounter (Signed)
Pt left vm for Dr. Juanda Crumble for specimen results please call

## 2017-06-10 NOTE — Telephone Encounter (Signed)
See result notes. 

## 2017-06-14 ENCOUNTER — Telehealth: Payer: Self-pay | Admitting: Gastroenterology

## 2017-06-14 DIAGNOSIS — L400 Psoriasis vulgaris: Secondary | ICD-10-CM | POA: Diagnosis not present

## 2017-06-14 NOTE — Telephone Encounter (Signed)
Please call patient with lab results

## 2017-06-14 NOTE — Telephone Encounter (Signed)
Pt notified. See results note.

## 2017-06-30 DIAGNOSIS — L400 Psoriasis vulgaris: Secondary | ICD-10-CM | POA: Diagnosis not present

## 2017-07-02 DIAGNOSIS — L400 Psoriasis vulgaris: Secondary | ICD-10-CM | POA: Diagnosis not present

## 2017-07-06 DIAGNOSIS — L400 Psoriasis vulgaris: Secondary | ICD-10-CM | POA: Diagnosis not present

## 2017-07-08 DIAGNOSIS — L400 Psoriasis vulgaris: Secondary | ICD-10-CM | POA: Diagnosis not present

## 2017-07-13 DIAGNOSIS — L400 Psoriasis vulgaris: Secondary | ICD-10-CM | POA: Diagnosis not present

## 2017-07-15 DIAGNOSIS — L400 Psoriasis vulgaris: Secondary | ICD-10-CM | POA: Diagnosis not present

## 2017-07-20 DIAGNOSIS — L400 Psoriasis vulgaris: Secondary | ICD-10-CM | POA: Diagnosis not present

## 2017-07-22 DIAGNOSIS — L400 Psoriasis vulgaris: Secondary | ICD-10-CM | POA: Diagnosis not present

## 2017-07-27 DIAGNOSIS — L400 Psoriasis vulgaris: Secondary | ICD-10-CM | POA: Diagnosis not present

## 2017-07-29 DIAGNOSIS — L400 Psoriasis vulgaris: Secondary | ICD-10-CM | POA: Diagnosis not present

## 2017-08-03 DIAGNOSIS — L400 Psoriasis vulgaris: Secondary | ICD-10-CM | POA: Diagnosis not present

## 2017-08-05 DIAGNOSIS — L400 Psoriasis vulgaris: Secondary | ICD-10-CM | POA: Diagnosis not present

## 2017-08-07 ENCOUNTER — Other Ambulatory Visit: Payer: Self-pay | Admitting: Internal Medicine

## 2017-08-09 ENCOUNTER — Telehealth: Payer: Self-pay | Admitting: Internal Medicine

## 2017-08-09 NOTE — Telephone Encounter (Signed)
Copied from [place] [id number]. Topic: Quick Communication - Rx Refill/Question >> Aug 09, 2017 10:02 AM Carolyn Stare wrote: Medication pravastatin (PRAVACHOL) 20 MG tablet  Has the patient contacted their pharmacy  yes    Preferred [place] [facility]   Agent: Please be advised that RX refills may take up to 3 business days. We ask that you follow-up with your pharmacy.

## 2017-08-10 DIAGNOSIS — L400 Psoriasis vulgaris: Secondary | ICD-10-CM | POA: Diagnosis not present

## 2017-08-12 DIAGNOSIS — L400 Psoriasis vulgaris: Secondary | ICD-10-CM | POA: Diagnosis not present

## 2017-08-16 ENCOUNTER — Ambulatory Visit: Payer: Medicare HMO | Admitting: Gastroenterology

## 2017-08-17 DIAGNOSIS — L400 Psoriasis vulgaris: Secondary | ICD-10-CM | POA: Diagnosis not present

## 2017-08-19 DIAGNOSIS — L400 Psoriasis vulgaris: Secondary | ICD-10-CM | POA: Diagnosis not present

## 2017-08-24 DIAGNOSIS — L400 Psoriasis vulgaris: Secondary | ICD-10-CM | POA: Diagnosis not present

## 2017-08-26 DIAGNOSIS — L400 Psoriasis vulgaris: Secondary | ICD-10-CM | POA: Diagnosis not present

## 2017-09-02 DIAGNOSIS — L400 Psoriasis vulgaris: Secondary | ICD-10-CM | POA: Diagnosis not present

## 2017-09-07 DIAGNOSIS — L400 Psoriasis vulgaris: Secondary | ICD-10-CM | POA: Diagnosis not present

## 2017-09-09 DIAGNOSIS — L400 Psoriasis vulgaris: Secondary | ICD-10-CM | POA: Diagnosis not present

## 2017-09-14 DIAGNOSIS — L400 Psoriasis vulgaris: Secondary | ICD-10-CM | POA: Diagnosis not present

## 2017-09-16 DIAGNOSIS — Z79899 Other long term (current) drug therapy: Secondary | ICD-10-CM | POA: Diagnosis not present

## 2017-09-16 DIAGNOSIS — L400 Psoriasis vulgaris: Secondary | ICD-10-CM | POA: Diagnosis not present

## 2017-09-20 DIAGNOSIS — L400 Psoriasis vulgaris: Secondary | ICD-10-CM | POA: Diagnosis not present

## 2017-09-23 DIAGNOSIS — L400 Psoriasis vulgaris: Secondary | ICD-10-CM | POA: Diagnosis not present

## 2017-10-05 DIAGNOSIS — L400 Psoriasis vulgaris: Secondary | ICD-10-CM | POA: Diagnosis not present

## 2017-10-06 ENCOUNTER — Ambulatory Visit: Payer: Medicare HMO | Admitting: Gastroenterology

## 2017-10-08 DIAGNOSIS — L400 Psoriasis vulgaris: Secondary | ICD-10-CM | POA: Diagnosis not present

## 2017-10-15 DIAGNOSIS — L400 Psoriasis vulgaris: Secondary | ICD-10-CM | POA: Diagnosis not present

## 2017-11-01 DIAGNOSIS — L400 Psoriasis vulgaris: Secondary | ICD-10-CM | POA: Diagnosis not present

## 2017-11-09 ENCOUNTER — Other Ambulatory Visit: Payer: Self-pay | Admitting: Internal Medicine

## 2017-11-09 DIAGNOSIS — L400 Psoriasis vulgaris: Secondary | ICD-10-CM | POA: Diagnosis not present

## 2017-11-16 DIAGNOSIS — L400 Psoriasis vulgaris: Secondary | ICD-10-CM | POA: Diagnosis not present

## 2017-11-22 DIAGNOSIS — Z79899 Other long term (current) drug therapy: Secondary | ICD-10-CM | POA: Diagnosis not present

## 2017-11-22 DIAGNOSIS — L400 Psoriasis vulgaris: Secondary | ICD-10-CM | POA: Diagnosis not present

## 2017-11-30 DIAGNOSIS — L400 Psoriasis vulgaris: Secondary | ICD-10-CM | POA: Diagnosis not present

## 2017-12-07 DIAGNOSIS — L400 Psoriasis vulgaris: Secondary | ICD-10-CM | POA: Diagnosis not present

## 2017-12-14 DIAGNOSIS — L400 Psoriasis vulgaris: Secondary | ICD-10-CM | POA: Diagnosis not present

## 2017-12-14 DIAGNOSIS — L97919 Non-pressure chronic ulcer of unspecified part of right lower leg with unspecified severity: Secondary | ICD-10-CM | POA: Diagnosis not present

## 2017-12-14 DIAGNOSIS — L97319 Non-pressure chronic ulcer of right ankle with unspecified severity: Secondary | ICD-10-CM | POA: Diagnosis not present

## 2017-12-21 ENCOUNTER — Telehealth: Payer: Self-pay | Admitting: Family

## 2017-12-21 NOTE — Telephone Encounter (Signed)
Pt will be seeing Mable Paris as TOC.

## 2017-12-27 DIAGNOSIS — L400 Psoriasis vulgaris: Secondary | ICD-10-CM | POA: Diagnosis not present

## 2018-01-04 DIAGNOSIS — L400 Psoriasis vulgaris: Secondary | ICD-10-CM | POA: Diagnosis not present

## 2018-01-18 DIAGNOSIS — L400 Psoriasis vulgaris: Secondary | ICD-10-CM | POA: Diagnosis not present

## 2018-02-08 DIAGNOSIS — I788 Other diseases of capillaries: Secondary | ICD-10-CM | POA: Diagnosis not present

## 2018-02-08 DIAGNOSIS — L400 Psoriasis vulgaris: Secondary | ICD-10-CM | POA: Diagnosis not present

## 2018-02-08 DIAGNOSIS — L570 Actinic keratosis: Secondary | ICD-10-CM | POA: Diagnosis not present

## 2018-02-12 ENCOUNTER — Other Ambulatory Visit: Payer: Self-pay | Admitting: Internal Medicine

## 2018-02-21 ENCOUNTER — Telehealth: Payer: Self-pay

## 2018-02-21 NOTE — Telephone Encounter (Signed)
I have not seen him in 15 months for his chronic issues. I will give him 30 day supply and he can get refilled when he transfers care. Please send in 30 day supply.

## 2018-02-21 NOTE — Telephone Encounter (Signed)
Patient requesting 90 day supply  Transfer of care appointment with Joycelyn Schmid 03/21/18

## 2018-02-24 DIAGNOSIS — L400 Psoriasis vulgaris: Secondary | ICD-10-CM | POA: Diagnosis not present

## 2018-02-25 MED ORDER — PRAVASTATIN SODIUM 20 MG PO TABS: 20.0000 mg | ORAL_TABLET | Freq: Every day | ORAL | 0 refills | Status: DC

## 2018-02-25 NOTE — Addendum Note (Signed)
Addended by: Lurlean Nanny on: 02/25/2018 12:40 PM   Modules accepted: Orders

## 2018-02-25 NOTE — Telephone Encounter (Signed)
Rx sent to [facility] 30 day supply

## 2018-03-17 DIAGNOSIS — L400 Psoriasis vulgaris: Secondary | ICD-10-CM | POA: Diagnosis not present

## 2018-03-21 ENCOUNTER — Ambulatory Visit (INDEPENDENT_AMBULATORY_CARE_PROVIDER_SITE_OTHER): Payer: Medicare HMO | Admitting: Family

## 2018-03-21 ENCOUNTER — Encounter: Payer: Self-pay | Admitting: Family

## 2018-03-21 ENCOUNTER — Encounter

## 2018-03-21 ENCOUNTER — Other Ambulatory Visit: Payer: Self-pay

## 2018-03-21 VITALS — BP 112/58 | HR 80 | Temp 97.6°F | Wt 198.4 lb

## 2018-03-21 DIAGNOSIS — L409 Psoriasis, unspecified: Secondary | ICD-10-CM

## 2018-03-21 DIAGNOSIS — R3911 Hesitancy of micturition: Secondary | ICD-10-CM | POA: Diagnosis not present

## 2018-03-21 DIAGNOSIS — E7800 Pure hypercholesterolemia, unspecified: Secondary | ICD-10-CM | POA: Diagnosis not present

## 2018-03-21 MED ORDER — TAMSULOSIN HCL 0.4 MG PO CAPS: 0.4000 mg | ORAL_CAPSULE | Freq: Every day | ORAL | 0 refills | Status: DC

## 2018-03-21 MED ORDER — PRAVASTATIN SODIUM 20 MG PO TABS: 20.0000 mg | ORAL_TABLET | Freq: Every day | ORAL | 0 refills | Status: DC

## 2018-03-21 NOTE — Progress Notes (Signed)
Subjective:    Patient ID: Lawrence Bush, male    DOB: 1933/07/14, 84 y.o.   MRN: [id number]  CC: Lawrence Bush is a 84 y.o. male who presents today to establish care.    HPI: Moved back from [place] 2017 to be near daughter.   Elevated PSA- chronically. Has never seem urology.  HLD- compliant with pravachol.   Difficultly urinating- No hematuria, dysuria. Continues to have noctura. prescribed flomax however never started.   Psoriasis - diagnosed by dermatologist. Following with dermatology, Dr Lawrence Bush, receiving injections ( unsure of name) with resolve.    Due tdap, pna    HISTORY:  Past Medical History:  Diagnosis Date  . Arthritis   . Diverticulitis   . History of colon polyps   . Hyperlipidemia    Past Surgical History:  Procedure Laterality Date  . SHOULDER SURGERY Left    Family History  Problem Relation Age of Onset  . Alzheimer's disease Father     Allergies: No known allergies Current Outpatient Medications on File Prior to Visit  Medication Sig Dispense Refill  . calcipotriene-betamethasone (TACLONEX) ointment     . loratadine (CLARITIN) 10 MG tablet Take 10 mg by mouth daily.    . Probiotic Product (PROBIOTIC-10 PO) Take 1 tablet by mouth daily.     No current facility-administered medications on file prior to visit.     Social History   Tobacco Use  . Smoking status: Former Smoker    Types: Cigarettes  . Smokeless tobacco: Never Used  . Tobacco comment: quit 2000  Substance Use Topics  . Alcohol use: Yes    Comment: occasional  . Drug use: No    Review of Systems  Constitutional: Negative for chills and fever.  Respiratory: Negative for cough.   Cardiovascular: Negative for chest pain and palpitations.  Gastrointestinal: Negative for nausea and vomiting.  Genitourinary: Positive for difficulty urinating. Negative for dysuria.      Objective:    BP (!) 112/58 (BP Location: Left Arm, Patient Position: Sitting, Cuff Size: Normal)   Pulse 80    Temp 97.6 F (36.4 C)   Wt 198 lb 6.4 oz (90 kg)   SpO2 96%   BMI 27.67 kg/m  BP Readings from Last 3 Encounters:  03/21/18 (!) 112/58  06/01/17 (!) 161/83  05/13/17 126/78   Wt Readings from Last 3 Encounters:  03/21/18 198 lb 6.4 oz (90 kg)  06/01/17 198 lb 6.4 oz (90 kg)  05/13/17 198 lb (89.8 kg)    Physical Exam  Constitutional: He appears well-developed and well-nourished.  Cardiovascular: Regular rhythm and normal heart sounds.  Pulmonary/Chest: Effort normal and breath sounds normal. No respiratory distress. He has no wheezes. He has no rhonchi. He has no rales.  Neurological: He is alert.  Skin: Skin is warm and dry.  Psychiatric: He has a normal mood and affect. His speech is normal and behavior is normal.  Vitals reviewed.      Assessment & Plan:   Problem List Items Addressed This Visit      Musculoskeletal and Integument   Psoriasis    Chronic. Improved. Will follow.         Other   Pure hypercholesterolemia    Pending  Lab. Continue therapy      Relevant Orders   Lipid panel   Urinary hesitancy - Primary    Chronic. Advised trial of flomax. Pending psa; if elevated, advised urology consult      Relevant Medications  tamsulosin (FLOMAX) 0.4 MG CAPS capsule   Other Relevant Orders   Comprehensive metabolic panel   PSA       I have discontinued Lawrence Bush's [employer]. I am also having him maintain his loratadine, Probiotic Product (PROBIOTIC-10 PO), calcipotriene-betamethasone, and tamsulosin.   Meds ordered this encounter  Medications  . tamsulosin (FLOMAX) 0.4 MG CAPS capsule    Sig: Take 1 capsule (0.4 mg total) by mouth daily.    Dispense:  30 capsule    Refill:  0    Order Specific Question:   Supervising Provider    Answer:   Lawrence Bush [[id number]]    Return precautions given.   Risks, benefits, and alternatives of the medications and treatment plan prescribed today were discussed, and patient expressed  understanding.   Education regarding symptom management and diagnosis given to patient on AVS.  Continue to follow with Lawrence Hawthorne, FNP for routine health maintenance.   Lawrence Bush and I agreed with plan.   Lawrence Paris, FNP

## 2018-03-21 NOTE — Assessment & Plan Note (Signed)
Chronic. Improved. Will follow.

## 2018-03-21 NOTE — Patient Instructions (Addendum)
Lets find out when you had pneumonia vaccines.   You need tetanus vaccine [facility] . Its called 'Tdap'.  Fasting labs when you can

## 2018-03-21 NOTE — Assessment & Plan Note (Signed)
Chronic. Advised trial of flomax. Pending psa; if elevated, advised urology consult

## 2018-03-21 NOTE — Assessment & Plan Note (Signed)
Pending  Lab. Continue therapy

## 2018-03-22 ENCOUNTER — Other Ambulatory Visit: Payer: Self-pay | Admitting: Family

## 2018-03-22 ENCOUNTER — Other Ambulatory Visit: Payer: Self-pay

## 2018-03-22 DIAGNOSIS — E7800 Pure hypercholesterolemia, unspecified: Secondary | ICD-10-CM | POA: Diagnosis not present

## 2018-03-22 DIAGNOSIS — R3911 Hesitancy of micturition: Secondary | ICD-10-CM | POA: Diagnosis not present

## 2018-03-23 LAB — COMPREHENSIVE METABOLIC PANEL
ALK PHOS: 57 IU/L (ref 39–117)
ALT: 20 IU/L (ref 0–44)
AST: 23 IU/L (ref 0–40)
Albumin/Globulin Ratio: 1.8 (ref 1.2–2.2)
Albumin: 4.5 g/dL (ref 3.5–4.7)
BILIRUBIN TOTAL: 0.6 mg/dL (ref 0.0–1.2)
BUN/Creatinine Ratio: 13 (ref 10–24)
BUN: 18 mg/dL (ref 8–27)
CHLORIDE: 102 mmol/L (ref 96–106)
CO2: 23 mmol/L (ref 20–29)
Calcium: 9.5 mg/dL (ref 8.6–10.2)
Creatinine, Ser: 1.36 mg/dL — ABNORMAL HIGH (ref 0.76–1.27)
GFR calc non Af Amer: 48 mL/min/{1.73_m2} — ABNORMAL LOW (ref >59)
GFR, EST AFRICAN AMERICAN: 56 mL/min/{1.73_m2} — AB (ref >59)
Globulin, Total: 2.5 g/dL (ref 1.5–4.5)
Glucose: 100 mg/dL — ABNORMAL HIGH (ref 65–99)
Potassium: 4.9 mmol/L (ref 3.5–5.2)
Sodium: 140 mmol/L (ref 134–144)
TOTAL PROTEIN: 7 g/dL (ref 6.0–8.5)

## 2018-03-23 LAB — PSA: Prostate Specific Ag, Serum: 5 ng/mL — ABNORMAL HIGH (ref 0.0–4.0)

## 2018-03-23 LAB — LIPID PANEL
Chol/HDL Ratio: 4 ratio (ref 0.0–5.0)
Cholesterol, Total: 208 mg/dL — ABNORMAL HIGH (ref 100–199)
HDL: 52 mg/dL (ref >39)
LDL Calculated: 121 mg/dL — ABNORMAL HIGH (ref 0–99)
TRIGLYCERIDES: 174 mg/dL — AB (ref 0–149)
VLDL Cholesterol Cal: 35 mg/dL (ref 5–40)

## 2018-03-25 DIAGNOSIS — H524 Presbyopia: Secondary | ICD-10-CM | POA: Diagnosis not present

## 2018-03-29 ENCOUNTER — Other Ambulatory Visit: Payer: Self-pay | Admitting: Family

## 2018-03-29 ENCOUNTER — Encounter: Payer: Self-pay | Admitting: Family

## 2018-03-29 DIAGNOSIS — R3911 Hesitancy of micturition: Secondary | ICD-10-CM

## 2018-03-30 ENCOUNTER — Other Ambulatory Visit: Payer: Self-pay

## 2018-03-30 MED ORDER — PRAVASTATIN SODIUM 40 MG PO TABS: 40.0000 mg | ORAL_TABLET | Freq: Every day | ORAL | 0 refills | Status: DC

## 2018-04-19 DIAGNOSIS — L400 Psoriasis vulgaris: Secondary | ICD-10-CM | POA: Diagnosis not present

## 2018-04-19 DIAGNOSIS — Z79899 Other long term (current) drug therapy: Secondary | ICD-10-CM | POA: Diagnosis not present

## 2018-04-19 DIAGNOSIS — L570 Actinic keratosis: Secondary | ICD-10-CM | POA: Diagnosis not present

## 2018-05-10 ENCOUNTER — Ambulatory Visit: Payer: Medicare HMO | Admitting: Urology

## 2018-05-10 ENCOUNTER — Encounter: Payer: Self-pay | Admitting: Urology

## 2018-05-10 VITALS — BP 172/72 | HR 82 | Ht 71.0 in | Wt 201.0 lb

## 2018-05-10 DIAGNOSIS — R3911 Hesitancy of micturition: Secondary | ICD-10-CM | POA: Diagnosis not present

## 2018-05-10 DIAGNOSIS — Z125 Encounter for screening for malignant neoplasm of prostate: Secondary | ICD-10-CM | POA: Diagnosis not present

## 2018-05-10 LAB — URINALYSIS, COMPLETE
Bilirubin, UA: NEGATIVE
Glucose, UA: NEGATIVE
Ketones, UA: NEGATIVE
NITRITE UA: NEGATIVE
Protein, UA: NEGATIVE
RBC, UA: NEGATIVE
Specific Gravity, UA: 1.02 (ref 1.005–1.030)
Urobilinogen, Ur: 0.2 mg/dL (ref 0.2–1.0)
pH, UA: 5.5 (ref 5.0–7.5)

## 2018-05-10 LAB — MICROSCOPIC EXAMINATION: RBC, UA: NONE SEEN /hpf (ref 0–2)

## 2018-05-10 LAB — BLADDER SCAN AMB NON-IMAGING

## 2018-05-10 NOTE — Progress Notes (Addendum)
   05/10/2018 10:35 AM   Tania Ade 1934-03-01 [id number]  Referring provider: Burnard Hawthorne, FNP [address] [place], [place] [address]  CC: "Elevated PSA"  HPI: I saw Mr. Rindfleisch in urology clinic today in consultation from Mable Paris, NP for "elevated PSA," and urinary hesitancy.  He is a very healthy 85 year old male with no family history of prostate cancer who recently moved to the area from [place].  He is very healthy and does not take medications regularly, aside from a statin.  He has noted some mild increase in urinary symptoms with some weak stream and urinary hesitancy.  He is minimally bothered by the symptoms.  PVR is 124cc in clinic today.  He tried Flomax nightly in December but this improved his symptoms minimally, and he discontinued the medication.  He was also found to have a PSA of 9.3 in July 2018, this was rechecked in August 2018 was 4.7, most recent PSA is stable at 5.0 in December 2019.  He denies any gross hematuria, bone pain, or weight loss.  There are no aggravating or alleviating factors.  Severity is mild.  PMH: Past Medical History:  Diagnosis Date  . Arthritis   . Diverticulitis   . History of colon polyps   . Hyperlipidemia     Surgical History: Past Surgical History:  Procedure Laterality Date  . SHOULDER SURGERY Left    Allergies:  Allergies  Allergen Reactions  . No Known Allergies     Family History: Family History  Problem Relation Age of Onset  . Alzheimer's disease Father     Social History:  reports that he has quit smoking. His smoking use included cigarettes. He has never used smokeless tobacco. He reports current alcohol use. He reports that he does not use drugs.  ROS: Please see flowsheet from today's date for complete review of systems.  Physical Exam: BP (!) 172/72   Pulse 82   Ht 5\' 11"  (1.803 m)   Wt 201 lb (91.2 kg)   BMI 28.03 kg/m    Constitutional:  Alert and oriented, No acute  distress. Cardiovascular: No clubbing, cyanosis, or edema. Respiratory: Normal respiratory effort, no increased work of breathing. GI: Abdomen is soft, nontender, nondistended, no abdominal masses GU: No CVA tenderness DRE: 40 g, smooth, no nodules or lesions Lymph: No cervical or inguinal lymphadenopathy. Skin: No rashes, bruises or suspicious lesions. Neurologic: Grossly intact, no focal deficits, moving all 4 extremities. Psychiatric: Normal mood and affect.  Laboratory Data: PSA 03/2018: 5.0 (normal for age)  Pertinent Imaging: None to review  Assessment & Plan:   In summary, the patient is an 85 year old healthy male with no family history of prostate cancer, normal PSA for his age, and normal DRE.  He has mild urinary symptoms that he is minimally bothered by.  We reviewed the implications of  PSA screening and the uncertainty surrounding it. In general, a man's PSA increases with age and is produced by both normal and cancerous prostate tissue. The differential diagnosis for elevated PSA includes BPH, prostate cancer, infection, recent intercourse/ejaculation, recent urethroscopic manipulation (foley placement/cystoscopy) or trauma, and prostatitis.  We discussed the AUA guidelines at length including the recommendation to discontinue screening in men over the age of 47.  Discontinue PSA screening RTC as needed  [place], [place] [address] [place], [place] [address] [phone]

## 2018-05-23 DIAGNOSIS — M76821 Posterior tibial tendinitis, right leg: Secondary | ICD-10-CM | POA: Diagnosis not present

## 2018-05-23 DIAGNOSIS — M19071 Primary osteoarthritis, right ankle and foot: Secondary | ICD-10-CM | POA: Diagnosis not present

## 2018-05-23 DIAGNOSIS — M79671 Pain in right foot: Secondary | ICD-10-CM | POA: Diagnosis not present

## 2018-05-23 DIAGNOSIS — M898X9 Other specified disorders of bone, unspecified site: Secondary | ICD-10-CM | POA: Diagnosis not present

## 2018-05-24 DIAGNOSIS — L400 Psoriasis vulgaris: Secondary | ICD-10-CM | POA: Diagnosis not present

## 2018-05-24 DIAGNOSIS — L570 Actinic keratosis: Secondary | ICD-10-CM | POA: Diagnosis not present

## 2018-05-24 DIAGNOSIS — Z79899 Other long term (current) drug therapy: Secondary | ICD-10-CM | POA: Diagnosis not present

## 2018-06-21 DIAGNOSIS — L400 Psoriasis vulgaris: Secondary | ICD-10-CM | POA: Diagnosis not present

## 2018-07-02 ENCOUNTER — Other Ambulatory Visit: Payer: Self-pay | Admitting: Family

## 2018-07-26 DIAGNOSIS — L400 Psoriasis vulgaris: Secondary | ICD-10-CM | POA: Diagnosis not present

## 2018-08-23 DIAGNOSIS — L400 Psoriasis vulgaris: Secondary | ICD-10-CM | POA: Diagnosis not present

## 2018-09-27 DIAGNOSIS — L400 Psoriasis vulgaris: Secondary | ICD-10-CM | POA: Diagnosis not present

## 2018-09-27 DIAGNOSIS — Z79899 Other long term (current) drug therapy: Secondary | ICD-10-CM | POA: Diagnosis not present

## 2018-09-27 DIAGNOSIS — L820 Inflamed seborrheic keratosis: Secondary | ICD-10-CM | POA: Diagnosis not present

## 2018-10-12 ENCOUNTER — Other Ambulatory Visit: Payer: Self-pay | Admitting: Family

## 2018-10-18 DIAGNOSIS — L400 Psoriasis vulgaris: Secondary | ICD-10-CM | POA: Diagnosis not present

## 2018-10-25 DIAGNOSIS — L400 Psoriasis vulgaris: Secondary | ICD-10-CM | POA: Diagnosis not present

## 2018-11-28 ENCOUNTER — Telehealth: Payer: Self-pay | Admitting: *Deleted

## 2018-11-28 NOTE — Telephone Encounter (Signed)
Copied from [place] [id number]. Topic: General - Other >> Nov 28, 2018 10:57 AM Rainey Pines A wrote: Patient would like to speak with nurse about getting bloodwork ordered for upcoming appointment on 12/02/2018  best contact [phone]

## 2018-11-29 NOTE — Telephone Encounter (Signed)
Called Pt and he would like his lab work sent to [facility] on [address] in [place]. Pt said he has an appointment this morning 11/29/18 near that [place] that's why he would like to go there

## 2018-11-30 ENCOUNTER — Telehealth: Payer: Self-pay | Admitting: Family

## 2018-11-30 ENCOUNTER — Other Ambulatory Visit: Payer: Self-pay | Admitting: Family

## 2018-11-30 ENCOUNTER — Telehealth: Payer: Self-pay | Admitting: Lab

## 2018-11-30 ENCOUNTER — Other Ambulatory Visit: Payer: Self-pay

## 2018-11-30 DIAGNOSIS — R197 Diarrhea, unspecified: Secondary | ICD-10-CM

## 2018-11-30 NOTE — Telephone Encounter (Signed)
Call pt I placed [facility] orders  He will need to speak with them in regards to containers.  I am also not sure of turnaround from [place] ; at [place] , the results are rapid.

## 2018-11-30 NOTE — Telephone Encounter (Signed)
Called Pt and he stated he will go to [facility] 12/01/18 in the morning, to pick up containers and have labs drawn

## 2018-11-30 NOTE — Telephone Encounter (Signed)
Called Pt and he will be going to [facility] 12/01/18 in the morning to pick up stool containers, and to have blood drawn there because of his insurance

## 2018-11-30 NOTE — Telephone Encounter (Signed)
Call pt He is due for f/u appt.   I havent seen him since 03/2018 and Im not sure what labs he is referring too. He had cholesterol drawn 03/2018.   He may make an annual appt and we can discuss all then

## 2018-11-30 NOTE — Telephone Encounter (Signed)
Call pt  If he has been having diarrhea, I would like stool culture, testing for blood in stool ( occult stool card) and blood work done prior to visit. Is there someone that can pick up stool containers/test from here so that he can return to [facility]?  He may go to [facility] for labs, stool.   Please triage for any other symptoms  Please make him aware that diarrhea can be symptom of covid. Does he want to be tested for COVID at [facility] ahead of appointment?  Does he have abdominal pain, N, v, epigastric burning, fever, dysuria, recent antibiotics, blood in stool?  Any one at home with symptoms?  Based on this, we can decide if safer to see him virtually or in person.

## 2018-11-30 NOTE — Telephone Encounter (Signed)
Called Pt and he stated he has been having the diarrhea for a couple weeks. He has been having some abdominal pain and cramping, he states that all goes along with the diarrhea. Pt also states he does not think he has COVID and does not want to be tested for it. Pt can come to the office tomorrow to pick up stool kits but has to take back to [facility] because of his insurance.

## 2018-12-01 ENCOUNTER — Other Ambulatory Visit: Payer: Self-pay | Admitting: Family

## 2018-12-01 DIAGNOSIS — R197 Diarrhea, unspecified: Secondary | ICD-10-CM | POA: Diagnosis not present

## 2018-12-02 ENCOUNTER — Ambulatory Visit: Payer: Medicare HMO | Admitting: Family

## 2018-12-02 ENCOUNTER — Other Ambulatory Visit: Payer: Self-pay

## 2018-12-02 DIAGNOSIS — Z0289 Encounter for other administrative examinations: Secondary | ICD-10-CM

## 2018-12-02 LAB — CBC WITH DIFFERENTIAL/PLATELET
Basophils Absolute: 0.1 10*3/uL (ref 0.0–0.2)
Basos: 1 %
EOS (ABSOLUTE): 0.3 10*3/uL (ref 0.0–0.4)
Eos: 5 %
Hematocrit: 37.7 % (ref 37.5–51.0)
Hemoglobin: 12.9 g/dL — ABNORMAL LOW (ref 13.0–17.7)
Immature Grans (Abs): 0 10*3/uL (ref 0.0–0.1)
Immature Granulocytes: 0 %
Lymphocytes Absolute: 2.4 10*3/uL (ref 0.7–3.1)
Lymphs: 32 %
MCH: 30.8 pg (ref 26.6–33.0)
MCHC: 34.2 g/dL (ref 31.5–35.7)
MCV: 90 fL (ref 79–97)
Monocytes Absolute: 1 10*3/uL — ABNORMAL HIGH (ref 0.1–0.9)
Monocytes: 13 %
Neutrophils Absolute: 3.8 10*3/uL (ref 1.4–7.0)
Neutrophils: 49 %
Platelets: 322 10*3/uL (ref 150–450)
RBC: 4.19 x10E6/uL (ref 4.14–5.80)
RDW: 12.7 % (ref 11.6–15.4)
WBC: 7.6 10*3/uL (ref 3.4–10.8)

## 2018-12-02 LAB — COMPREHENSIVE METABOLIC PANEL WITH GFR
ALT: 20 [IU]/L (ref 0–44)
AST: 21 [IU]/L (ref 0–40)
Albumin/Globulin Ratio: 2 (ref 1.2–2.2)
Albumin: 4.9 g/dL — ABNORMAL HIGH (ref 3.6–4.6)
Alkaline Phosphatase: 66 [IU]/L (ref 39–117)
BUN/Creatinine Ratio: 18 (ref 10–24)
BUN: 23 mg/dL (ref 8–27)
Bilirubin Total: 0.5 mg/dL (ref 0.0–1.2)
CO2: 24 mmol/L (ref 20–29)
Calcium: 9.6 mg/dL (ref 8.6–10.2)
Chloride: 103 mmol/L (ref 96–106)
Creatinine, Ser: 1.3 mg/dL — ABNORMAL HIGH (ref 0.76–1.27)
GFR calc Af Amer: 58 mL/min/{1.73_m2} — ABNORMAL LOW
GFR calc non Af Amer: 50 mL/min/{1.73_m2} — ABNORMAL LOW
Globulin, Total: 2.4 g/dL (ref 1.5–4.5)
Glucose: 93 mg/dL (ref 65–99)
Potassium: 4.7 mmol/L (ref 3.5–5.2)
Sodium: 142 mmol/L (ref 134–144)
Total Protein: 7.3 g/dL (ref 6.0–8.5)

## 2018-12-02 LAB — LIPASE: Lipase: 25 U/L (ref 13–78)

## 2018-12-02 LAB — FECAL OCCULT BLOOD, IMMUNOCHEMICAL: Fecal Occult Bld: NEGATIVE

## 2018-12-02 NOTE — Telephone Encounter (Signed)
Spoke with patient this morning when he came in for appointment, patient was very upset as to why no one called and the confusion on ordering his labs, advised patient that I was not aware but would be glad to help him resolve any issue's.  Patient stated he called las week spoke with someone and advised he was having issue's with diarrhea, and he was wanting to have test completed to find the cause, or to R/O infection as cause. Patient did not want to see provider until after all labs were returned, so would have answer to his problems and could discuss results. He tried to explain this each time he spoke with someone. Advised patient he would not be charged for a no show and we could reschedule the visit, and explained and give copy of the labs that had resulted. Patient advised that the diarrhea did not start until he started taking Cosentyx injections, that usually within a day after injection diarrhea started and last about a week, along with no appetite. Patient to day was having no diarrhea and appetite back to normal. Advised patient when stool studies return I would call with results and we can go from there as when to schedule next OV.

## 2018-12-03 LAB — CLOSTRIDIUM DIFFICILE BY PCR: Toxigenic C. Difficile by PCR: NEGATIVE

## 2018-12-05 NOTE — Telephone Encounter (Signed)
Noted Following labs at this time; when complete will send to [facility] and sch follow up

## 2018-12-06 ENCOUNTER — Telehealth: Payer: Self-pay

## 2018-12-06 LAB — CDIFF NAA+O+P+STOOL CULTURE
E coli, Shiga toxin Assay: NEGATIVE
Toxigenic C. Difficile by PCR: NEGATIVE

## 2018-12-06 NOTE — Telephone Encounter (Signed)
See previous note

## 2018-12-06 NOTE — Telephone Encounter (Signed)
Copied from [place] [id number]. Topic: General - Other >> Dec 06, 2018  4:21 PM Leward Quan A wrote: Reason for CRM: Pateint would like a call back from Kerin Salen in reference to some test results. Can be reached at Ph#  [phone]

## 2018-12-06 NOTE — Telephone Encounter (Signed)
Patient says he has had some loose stools 2 today I did advise opatient of the results I could see from his stool studies, he says he is glad to hear the results, he is wondering what might be causing these symptoms of his stomach rumbling , gaseous and loose stools . Advised patient if he wanted to FU with PCP I would be glad to schedule , or he might want to call the doctor that prescribes the coesyntex and advise of his symptoms they maybe able to offer some supportive advice as to if this medication has had this effect in other patients or not. Patient agreed to speak to this doctor at next injection due 12/16/18. Patient does not wish to FU with PCP til after consulting with the provider of Coesyntex.

## 2018-12-06 NOTE — Telephone Encounter (Signed)
Pt is calling back wanting to talk to Juliann Pulse about his continuing condition, not urgent he said but still wanted to speak to her at time of call. Advised with a patient and would return call at [phone]

## 2018-12-07 NOTE — Telephone Encounter (Signed)
Lawrence Bush, Thank you for following up with him. Yes, his studies appeared negative for bacterial infection or blood. I have released him to his mychart and also advised him to make a f/u with [place] . If he would like to speak with his orthopedic first, that is fine, however I would impress upon him that I would like to evaluate him and likely order imaging of the abdomen.   Would you call him this week to see if he has spoken with orthopedic and how he is doing?

## 2018-12-09 ENCOUNTER — Telehealth: Payer: Self-pay | Admitting: Family

## 2018-12-09 NOTE — Telephone Encounter (Signed)
Juliann Pulse,   I sent you a note on 11/30/18 about his diarrhea On my desk I have a cosentyx side effect sheet. I assume patient dropped off?   I would strongly advise him to discus this orthopedic who appears to be treating psoriasis with this medication. This medication lowers the ability of immune system to fight infection.  I do not prescribe this medication so I am not familiar with side effects however it shows inflammatory bowel disease .   Certainly the diarrhea needs to be discussed with orthopedic is the main point.   Please make f/u here as well

## 2018-12-09 NOTE — Telephone Encounter (Signed)
Spoke with Juliann Pulse in person cosentyx sheet on my desk was from 12/06/18 Patient declines appt here until he sees his orthopedic  Closing encounter

## 2018-12-16 ENCOUNTER — Encounter: Payer: Self-pay | Admitting: Family

## 2018-12-16 ENCOUNTER — Ambulatory Visit (INDEPENDENT_AMBULATORY_CARE_PROVIDER_SITE_OTHER): Payer: Medicare HMO | Admitting: Family

## 2018-12-16 ENCOUNTER — Telehealth: Payer: Self-pay | Admitting: Family

## 2018-12-16 ENCOUNTER — Encounter: Payer: Self-pay | Admitting: *Deleted

## 2018-12-16 ENCOUNTER — Other Ambulatory Visit: Payer: Self-pay | Admitting: Family

## 2018-12-16 ENCOUNTER — Telehealth: Payer: Self-pay | Admitting: *Deleted

## 2018-12-16 ENCOUNTER — Other Ambulatory Visit: Payer: Self-pay

## 2018-12-16 ENCOUNTER — Ambulatory Visit
Admission: RE | Admit: 2018-12-16 | Discharge: 2018-12-16 | Disposition: A | Discharge status: Home / Self Care | Payer: Medicare HMO | Source: Ambulatory Visit | Attending: Family | Admitting: Family

## 2018-12-16 DIAGNOSIS — K5730 Diverticulosis of large intestine without perforation or abscess without bleeding: Secondary | ICD-10-CM | POA: Insufficient documentation

## 2018-12-16 DIAGNOSIS — R972 Elevated prostate specific antigen [PSA]: Secondary | ICD-10-CM | POA: Insufficient documentation

## 2018-12-16 DIAGNOSIS — R109 Unspecified abdominal pain: Secondary | ICD-10-CM

## 2018-12-16 DIAGNOSIS — N400 Enlarged prostate without lower urinary tract symptoms: Secondary | ICD-10-CM | POA: Insufficient documentation

## 2018-12-16 DIAGNOSIS — R197 Diarrhea, unspecified: Secondary | ICD-10-CM | POA: Diagnosis not present

## 2018-12-16 DIAGNOSIS — K4020 Bilateral inguinal hernia, without obstruction or gangrene, not specified as recurrent: Secondary | ICD-10-CM | POA: Insufficient documentation

## 2018-12-16 DIAGNOSIS — K4090 Unilateral inguinal hernia, without obstruction or gangrene, not specified as recurrent: Secondary | ICD-10-CM | POA: Diagnosis not present

## 2018-12-16 NOTE — Telephone Encounter (Signed)
Copied from [place] [id number]. Topic: General - Other >> Dec 16, 2018  9:56 AM Virl Axe D wrote: Reason for CRM: Pt requesting return callback from [place] regarding recommendations for gastro issues. Please advies. CB#[phone]

## 2018-12-16 NOTE — Assessment & Plan Note (Signed)
Chronic; advised per urology to dc screening PSA.

## 2018-12-16 NOTE — Telephone Encounter (Signed)
Call pt Preliminary report No acute findings on CT ab. No diverticulitis  Can we place consult back to GI for diarrhea? I would recommend.

## 2018-12-16 NOTE — Assessment & Plan Note (Signed)
Afebrile. He does have weight loss, decreased appetite. H/o bowel resection. Interested as diarrhea is precipitated by eating, otherwise will improve. I dont suspect cosentyx. Discussed imodium was result of CT Ab show no infection. Advised consult again with Dr Ronelle Nigh; patient will consider this. He will continue probiotic.

## 2018-12-16 NOTE — Telephone Encounter (Signed)
Patient spoke to Lawrence Bush & to go over findings of CT.

## 2018-12-16 NOTE — Progress Notes (Signed)
Verbal consent for services obtained from patient prior to services given to TELEPHONE visit:   Location of call:  provider at work patient at home  Names of all persons present for services: Mable Paris, NP Chief complaint:   CC: brown water diarrhea x 3 weeks, waxes and wanes. Trigger is food. Stopped lactose without improvement.  Feels gurgling center of abdomen prior to having episode. May feel 'uncomfortable' before diarrhea. No blood in stool.    Lost 8lbs weight loss over past month. Some decrease in appetite.   No recent antibiotic. No severe abdominal pain, fever, epigastric burning, trouble swallowing, N, v. On probiotic.  Otherwise 'feels well.'   Ate scrambled egg, half muffin, and had 3 episodes of diarrhea.   On cosentyx for 18 months psoriatic arthritis which has been helpful for PsA; follows with Dr Nicole Kindred  History, background, results pertinent:  H/o diverticulitis H/o Bowel surgery.  Elevated PSA- saw Dr Diamantina Providence 04/2018; advised to dc screening PSA.  Dr Ronelle Nigh 05/2017  A/P/next steps: Problem List Items Addressed This Visit      Other   Diarrhea    Afebrile. He does have weight loss, decreased appetite. H/o bowel resection. Interested as diarrhea is precipitated by eating, otherwise will improve. I dont suspect cosentyx. Discussed imodium was result of CT Ab show no infection. Advised consult again with Dr Ronelle Nigh; patient will consider this. He will continue probiotic.       Elevated PSA    Chronic; advised per urology to dc screening PSA.           I spent 25 min  discussing plan of care over the phone.

## 2018-12-16 NOTE — Telephone Encounter (Signed)
Patient wanted to bring up to date he stated he is still having diarrhea, 3 episodes already today has stomach discomfort ,and gaseous, has been taking probiotics for 9 days to see if this would help but no change. Patient ask was there anyway PCP could order either an X-ray of the abdomen or a scan?

## 2018-12-16 NOTE — Telephone Encounter (Signed)
Patient ate a sandwich around 10 am , he greatful to get CT and agrees to telephone visit at 1:15

## 2018-12-16 NOTE — Telephone Encounter (Signed)
Lawrence Bush, This is very concerning  Does he feel like this is diverticulitis? He has a h/o of diverticulitis.   When is last time he ate?  Yes, CT ab stat ordered. Please advise that Lawrence Bush will be contacting him  Can he be my 1:15 pt today?   He can come in person but I feel this needs to be addressed asap. If you feel he needs to be phone call and is he agreeable we can do that.   Lawrence Bush, this is stat CT . Patient has kidney disease. What are their protocols for contrast? Should I order non contrast based on renal function?

## 2018-12-20 ENCOUNTER — Telehealth: Payer: Self-pay | Admitting: Gastroenterology

## 2018-12-20 NOTE — Telephone Encounter (Signed)
Left vm for pt returning  His vm to offer apt with Dr. Darene Lamer

## 2018-12-20 NOTE — Telephone Encounter (Signed)
Spoke with patient in regards to Ct Ab and no acute findings. Al questions answered.   Advised that call Dr Bonna Gains to make a follow up and I provided him with number.  I also messaged her as well.

## 2018-12-20 NOTE — Telephone Encounter (Signed)
close

## 2018-12-26 ENCOUNTER — Other Ambulatory Visit: Payer: Self-pay

## 2018-12-27 ENCOUNTER — Other Ambulatory Visit: Payer: Self-pay

## 2018-12-27 ENCOUNTER — Encounter: Payer: Self-pay | Admitting: Gastroenterology

## 2018-12-27 ENCOUNTER — Ambulatory Visit (INDEPENDENT_AMBULATORY_CARE_PROVIDER_SITE_OTHER): Payer: Medicare HMO | Admitting: Gastroenterology

## 2018-12-27 ENCOUNTER — Encounter: Payer: Self-pay | Admitting: *Deleted

## 2018-12-27 VITALS — BP 156/71 | HR 63 | Temp 96.6°F | Wt 186.4 lb

## 2018-12-27 DIAGNOSIS — R197 Diarrhea, unspecified: Secondary | ICD-10-CM | POA: Diagnosis not present

## 2018-12-27 DIAGNOSIS — L570 Actinic keratosis: Secondary | ICD-10-CM | POA: Diagnosis not present

## 2018-12-27 DIAGNOSIS — L400 Psoriasis vulgaris: Secondary | ICD-10-CM | POA: Diagnosis not present

## 2018-12-27 MED ORDER — NA SULFATE-K SULFATE-MG SULF 17.5-3.13-1.6 GM/177ML PO SOLN: 354.0000 mL | Freq: Once | ORAL | 0 refills | Status: AC

## 2018-12-27 MED ORDER — BISACODYL EC 5 MG PO TBEC: DELAYED_RELEASE_TABLET | ORAL | 0 refills | Status: AC

## 2018-12-27 MED ORDER — LOPERAMIDE HCL 2 MG PO CAPS: ORAL_CAPSULE | ORAL | 0 refills | Status: AC

## 2018-12-27 NOTE — Progress Notes (Signed)
Lawrence Antigua, MD [address]  [place]  [place], [place] [address]  Main: [phone]  Fax: [phone]   Primary Care Physician: Burnard Hawthorne, FNP   Chief Complaint  Patient presents with   Diarrhea    Has been going on for 5-7 weeks. Patient states he will have lower abdominal discomfort wiith bowel movements. He has diarrhea 4-8 times  a day     HPI: Lawrence Bush is a 85 y.o. male previously seen in February 2019 with diarrhea that had already been resolved when he came to [place].  He has been asymptomatic until 6 weeks ago.  He started having diarrhea at that time, reports 3-4 loose bowel movements a day, with no blood.  Abdominal cramping associated with the episodes.  No abdominal pain.  No nausea or vomiting.  No recent antibiotics use.  No recent travel.  No sick contacts.  No fever or chills.  Patient underwent work-up with PCP with negative C. difficile, Salmonella, Shigella, Campylobacter, ova and parasites, E. Coli.  CT scan showed occasional descending colonic diverticulosis with evidence of prior sigmoid resection anastomosis.  No acute inflammatory findings.  Patient has had history of partial colon resection due to recurrent diverticulitis in the 90s.  Since then he has not had any diarrhea, altered bowel habits, abdominal pain or discomfort.  Patient has had a colonoscopy within the last 5 to 6 years at an outside facility in [place].    See scanned report in chart under procedures.  May 2013 colonoscopy by Corey Harold, MD at [place] of him at [facility] with indication being history of colon polyps.  "Normal mucosa in the whole colon" reported.  Recommendations were to repeat colonoscopy in 5 years.  I do not have any previous colonoscopy reports prior to this.  Current Outpatient Medications  Medication Sig Dispense Refill   calcipotriene-betamethasone (TACLONEX) ointment      clobetasol cream (TEMOVATE) 0.05 % APPLY CREAM TOPICALLY  TO AFFECTED AREAS (PSORIASIS) ONCE TO TWICE DAILY AS NEEDED AVOID FACE GROIN AXILLA     loratadine (CLARITIN) 10 MG tablet Take 10 mg by mouth daily.     pravastatin (PRAVACHOL) 40 MG tablet Take 40 mg by mouth daily.     Probiotic Product (PROBIOTIC-10 PO) Take 1 tablet by mouth daily.     No current facility-administered medications for this visit.     Allergies as of 12/27/2018 - Review Complete 12/27/2018  Allergen Reaction Noted   No known allergies  11/11/2016    ROS:  General: Negative for anorexia, weight loss, fever, chills, fatigue, weakness. ENT: Negative for hoarseness, difficulty swallowing , nasal congestion. CV: Negative for chest pain, angina, palpitations, dyspnea on exertion, peripheral edema.  Respiratory: Negative for dyspnea at rest, dyspnea on exertion, cough, sputum, wheezing.  GI: See history of present illness. GU:  Negative for dysuria, hematuria, urinary incontinence, urinary frequency, nocturnal urination.  Endo: Negative for unusual weight change.    Physical Examination:   BP (!) 156/71 (BP Location: Left Arm, Patient Position: Sitting, Cuff Size: Normal)    Pulse 63    Temp (!) 96.6 F (35.9 C) (Tympanic)    Wt 186 lb 6 oz (84.5 kg)    BMI 25.99 kg/m   General: Well-nourished, well-developed in no acute distress.  Eyes: No icterus. Conjunctivae pink. Mouth: Oropharyngeal mucosa moist and pink , no lesions erythema or exudate. Neck: Supple, Trachea midline Abdomen: Bowel sounds are normal, nontender, nondistended, no hepatosplenomegaly or masses, no  abdominal bruits or hernia , no rebound or guarding.   Extremities: No lower extremity edema. No clubbing or deformities. Neuro: Alert and oriented x 3.  Grossly intact. Skin: Warm and dry, no jaundice.   Psych: Alert and cooperative, normal mood and affect.   Labs: CMP     Component Value Date/Time   NA 142 12/01/2018 0815   K 4.7 12/01/2018 0815   CL 103 12/01/2018 0815   CO2 24 12/01/2018  0815   GLUCOSE 93 12/01/2018 0815   GLUCOSE 119 (H) 11/10/2016 1454   BUN 23 12/01/2018 0815   CREATININE 1.30 (H) 12/01/2018 0815   CALCIUM 9.6 12/01/2018 0815   PROT 7.3 12/01/2018 0815   ALBUMIN 4.9 (H) 12/01/2018 0815   AST 21 12/01/2018 0815   ALT 20 12/01/2018 0815   ALKPHOS 66 12/01/2018 0815   BILITOT 0.5 12/01/2018 0815   GFRNONAA 50 (L) 12/01/2018 0815   GFRAA 58 (L) 12/01/2018 0815   Lab Results  Component Value Date   WBC 7.6 12/01/2018   HGB 12.9 (L) 12/01/2018   HCT 37.7 12/01/2018   MCV 90 12/01/2018   PLT 322 12/01/2018    Imaging Studies: Ct Abdomen Pelvis Wo Contrast  Result Date: 12/16/2018 CLINICAL DATA:  Diarrhea, concern for diverticulitis EXAM: CT ABDOMEN AND PELVIS WITHOUT CONTRAST TECHNIQUE: Multidetector CT imaging of the abdomen and pelvis was performed following the standard protocol without IV contrast. Oral enteric contrast was administered. COMPARISON:  None. FINDINGS: Lower chest: No acute abnormality. Mild subpleural fibrotic change in the included lung bases. Coronary artery calcifications. Hepatobiliary: No solid liver abnormality is seen. No gallstones, gallbladder wall thickening, or biliary dilatation. Pancreas: Unremarkable. No pancreatic ductal dilatation or surrounding inflammatory changes. Spleen: Normal in size without significant abnormality. Adrenals/Urinary Tract: Adrenal glands are unremarkable. Kidneys are normal, without renal calculi, solid lesion, or hydronephrosis. Bladder is unremarkable. Stomach/Bowel: Stomach is within normal limits. Appendix appears normal. No evidence of bowel wall thickening, distention, or inflammatory changes. Occasional descending colonic diverticulosis. Evidence of prior sigmoid resection and anastomosis. Vascular/Lymphatic: Aortic atherosclerosis. No enlarged abdominal or pelvic lymph nodes. Reproductive: Prostatomegaly. Other: Fat containing right greater than left inguinal hernias. No abdominopelvic ascites.  Musculoskeletal: No acute or significant osseous findings. IMPRESSION: 1. Occasional descending colonic diverticulosis. Evidence of prior sigmoid resection and anastomosis. No acute inflammatory findings to suggest diverticulitis. 2.  Prostatomegaly. 3.  Fat containing right greater than left inguinal hernias. 4.  Other chronic and incidental findings as detailed above. Electronically Signed   By: Eddie Candle M.D.   On: 12/16/2018 14:43    Assessment and Plan:   Ramzey Fedak is a 85 y.o. y/o male with previous history of diverticulitis and partial colon resection in the 1990s, now with diarrhea for the last 6 weeks  Stool testing did not include testing can cause diarrhea We will order GI profile to complete infectious work-up Obtain fecal calprotectin and pancreatic elastase as well  However, due to ongoing symptoms and weight loss and decrease in appetite, he is concerned about his symptoms and is inquiring about colonoscopy is otherwise healthy discussed risk and benefits of colonoscopy to rule out microscopic colitis  I have discussed alternative options, risks & benefits,  which include, but are not limited to, bleeding, infection, perforation,respiratory complication & drug reaction.  The patient agrees with this plan & written consent will be obtained.    However, I have urged him to get the stool test within 1 to 2 days and if no signs  of infection, can proceed with colonoscopy to evaluate for microscopic colitis  Dr Lawrence Bush

## 2018-12-30 ENCOUNTER — Other Ambulatory Visit
Admission: RE | Admit: 2018-12-30 | Discharge: 2018-12-30 | Disposition: A | Discharge status: Home / Self Care | Payer: Medicare HMO | Source: Ambulatory Visit | Attending: Gastroenterology | Admitting: Gastroenterology

## 2018-12-30 ENCOUNTER — Other Ambulatory Visit: Payer: Self-pay

## 2018-12-30 DIAGNOSIS — Z20828 Contact with and (suspected) exposure to other viral communicable diseases: Secondary | ICD-10-CM | POA: Diagnosis not present

## 2018-12-30 DIAGNOSIS — Z01812 Encounter for preprocedural laboratory examination: Secondary | ICD-10-CM | POA: Diagnosis not present

## 2018-12-30 LAB — SARS CORONAVIRUS 2 (TAT 6-24 HRS): SARS Coronavirus 2: NEGATIVE

## 2019-01-02 NOTE — Anesthesia Preprocedure Evaluation (Addendum)
Anesthesia Evaluation  Patient identified by MRN, date of birth, ID band Patient awake    Reviewed: Allergy & Precautions, NPO status , Patient's Chart, lab work & pertinent test results  History of Anesthesia Complications Negative for: history of anesthetic complications  Airway Mallampati: II  TM Distance: >3 FB Neck ROM: Full    Dental   Pulmonary former smoker,    breath sounds clear to auscultation       Cardiovascular (-) angina(-) DOE  Rhythm:Regular Rate:Normal   HLD   Neuro/Psych    GI/Hepatic neg GERD  , Diverticulitis   Endo/Other    Renal/GU CRFRenal disease     Musculoskeletal  (+) Arthritis ,   Abdominal   Peds  Hematology   Anesthesia Other Findings Psoriasis  Reproductive/Obstetrics                            Anesthesia Physical Anesthesia Plan  ASA: II  Anesthesia Plan: General   Post-op Pain Management:    Induction: Intravenous  PONV Risk Score and Plan: 2 and Propofol infusion and TIVA  Airway Management Planned: Natural Airway and Nasal Cannula  Additional Equipment:   Intra-op Plan:   Post-operative Plan:   Informed Consent: I have reviewed the patients History and Physical, chart, labs and discussed the procedure including the risks, benefits and alternatives for the proposed anesthesia with the patient or authorized representative who has indicated his/her understanding and acceptance.       Plan Discussed with: CRNA and Anesthesiologist  Anesthesia Plan Comments:         Anesthesia Quick Evaluation

## 2019-01-03 ENCOUNTER — Ambulatory Visit: Payer: Medicare HMO | Admitting: Anesthesiology

## 2019-01-03 ENCOUNTER — Encounter
Admission: RE | Disposition: A | Discharge status: Home / Self Care | Payer: Self-pay | Source: Home / Self Care | Attending: Gastroenterology

## 2019-01-03 ENCOUNTER — Ambulatory Visit
Admission: RE | Admit: 2019-01-03 | Discharge: 2019-01-03 | Disposition: A | Discharge status: Home / Self Care | Payer: Medicare HMO | Attending: Gastroenterology | Admitting: Gastroenterology

## 2019-01-03 DIAGNOSIS — R194 Change in bowel habit: Secondary | ICD-10-CM | POA: Diagnosis not present

## 2019-01-03 DIAGNOSIS — K5730 Diverticulosis of large intestine without perforation or abscess without bleeding: Secondary | ICD-10-CM | POA: Diagnosis not present

## 2019-01-03 DIAGNOSIS — K52831 Collagenous colitis: Secondary | ICD-10-CM | POA: Diagnosis not present

## 2019-01-03 DIAGNOSIS — Z87891 Personal history of nicotine dependence: Secondary | ICD-10-CM | POA: Insufficient documentation

## 2019-01-03 DIAGNOSIS — E785 Hyperlipidemia, unspecified: Secondary | ICD-10-CM | POA: Insufficient documentation

## 2019-01-03 DIAGNOSIS — R197 Diarrhea, unspecified: Secondary | ICD-10-CM | POA: Diagnosis not present

## 2019-01-03 DIAGNOSIS — M1990 Unspecified osteoarthritis, unspecified site: Secondary | ICD-10-CM | POA: Insufficient documentation

## 2019-01-03 HISTORY — PX: POLYPECTOMY: SHX5525

## 2019-01-03 HISTORY — DX: Psoriasis, unspecified: L40.9

## 2019-01-03 HISTORY — DX: Presence of dental prosthetic device (complete) (partial): Z97.2

## 2019-01-03 HISTORY — PX: COLONOSCOPY WITH PROPOFOL: SHX5780

## 2019-01-03 SURGERY — COLONOSCOPY WITH PROPOFOL
Anesthesia: General

## 2019-01-03 MED ORDER — LIDOCAINE HCL (CARDIAC) PF 100 MG/5ML IV SOSY
PREFILLED_SYRINGE | INTRAVENOUS | Status: DC | PRN
  Administered 2019-01-03: 30 mg via INTRAVENOUS

## 2019-01-03 MED ORDER — STERILE WATER FOR IRRIGATION IR SOLN
Status: DC | PRN
  Administered 2019-01-03: 100 mL

## 2019-01-03 MED ORDER — LACTATED RINGERS IV SOLN
100.0000 mL/h | INTRAVENOUS | Status: DC
  Administered 2019-01-03: 100 mL/h via INTRAVENOUS

## 2019-01-03 MED ORDER — PROPOFOL 10 MG/ML IV BOLUS
INTRAVENOUS | Status: DC | PRN
  Administered 2019-01-03: 50 mg via INTRAVENOUS
  Administered 2019-01-03: 100 mg via INTRAVENOUS
  Administered 2019-01-03 (×4): 50 mg via INTRAVENOUS

## 2019-01-03 SURGICAL SUPPLY — 24 items
CANISTER SUCT 1200ML W/VALVE (MISCELLANEOUS) ×4 IMPLANT
CLIP HMST 235XBRD CATH ROT (MISCELLANEOUS) IMPLANT
CLIP RESOLUTION 360 11X235 (MISCELLANEOUS)
ELECT REM PT RETURN 9FT ADLT (ELECTROSURGICAL)
ELECTRODE REM PT RTRN 9FT ADLT (ELECTROSURGICAL) IMPLANT
FCP ESCP3.2XJMB 240X2.8X (MISCELLANEOUS) ×2
FORCEPS BIOP RAD 4 LRG CAP 4 (CUTTING FORCEPS) IMPLANT
FORCEPS BIOP RJ4 240 W/NDL (MISCELLANEOUS) ×2
FORCEPS ESCP3.2XJMB 240X2.8X (MISCELLANEOUS) ×2 IMPLANT
GOWN CVR UNV OPN BCK APRN NK (MISCELLANEOUS) ×4 IMPLANT
GOWN ISOL THUMB LOOP REG UNIV (MISCELLANEOUS) ×4
INJECTOR VARIJECT VIN23 (MISCELLANEOUS) IMPLANT
KIT DEFENDO VALVE AND CONN (KITS) IMPLANT
KIT ENDO PROCEDURE OLY (KITS) ×4 IMPLANT
MARKER SPOT ENDO TATTOO 5ML (MISCELLANEOUS) IMPLANT
PROBE APC STR FIRE (PROBE) IMPLANT
RETRIEVER NET ROTH 2.5X230 LF (MISCELLANEOUS) IMPLANT
SNARE SHORT THROW 13M SML OVAL (MISCELLANEOUS) IMPLANT
SNARE SHORT THROW 30M LRG OVAL (MISCELLANEOUS) IMPLANT
SNARE SNG USE RND 15MM (INSTRUMENTS) IMPLANT
SPOT EX ENDOSCOPIC TATTOO (MISCELLANEOUS)
TRAP ETRAP POLY (MISCELLANEOUS) IMPLANT
VARIJECT INJECTOR VIN23 (MISCELLANEOUS)
WATER STERILE IRR 250ML POUR (IV SOLUTION) ×4 IMPLANT

## 2019-01-03 NOTE — Op Note (Signed)
[facility] Gastroenterology Patient Name: Lawrence Bush Procedure Date: 01/03/2019 9:19 AM MRN: [id number] Account #: [id number] Date of Birth: 04-Feb-1934 Admit Type: Outpatient Age: 85 Room: [facility] OR ROOM 01 Gender: Male Note Status: Finalized Procedure:            Colonoscopy Indications:          Clinically significant diarrhea of unexplained origin Providers:            Varnita B. Bonna Gains MD, MD Referring MD:         Yvetta Coder. Arnett (Referring MD) Medicines:            Monitored Anesthesia Care Complications:        No immediate complications. Procedure:            Pre-Anesthesia Assessment:                       - Prior to the procedure, a History and Physical was                        performed, and patient medications, allergies and                        sensitivities were reviewed. The patient's tolerance of                        previous anesthesia was reviewed.                       - The risks and benefits of the procedure and the                        sedation options and risks were discussed with the                        patient. All questions were answered and informed                        consent was obtained.                       - Patient identification and proposed procedure were                        verified prior to the procedure by the physician, the                        nurse, the anesthetist and the technician. The                        procedure was verified in the pre-procedure area in the                        procedure room in the endoscopy suite.                       - ASA Grade Assessment: II - A patient with mild                        systemic disease.                       -  After reviewing the risks and benefits, the patient                        was deemed in satisfactory condition to undergo the                        procedure.                       After obtaining informed consent, the colonoscope was                     passed under direct vision. Throughout the procedure,                        the patient's blood pressure, pulse, and oxygen                        saturations were monitored continuously. The was                        introduced through the anus and advanced to the the                        cecum, identified by appendiceal orifice and ileocecal                        valve. The colonoscopy was performed with ease. The                        patient tolerated the procedure well. The quality of                        the bowel preparation was good. Findings:      The perianal and digital rectal examinations were normal.      A few diverticula were found in the sigmoid colon.      The rectum, sigmoid colon, descending colon, transverse colon, ascending       colon and cecum appeared normal. Biopsies for histology were taken with       a cold forceps from the cecum, ascending colon, transverse colon,       descending colon, sigmoid colon and rectum for evaluation of microscopic       colitis.      The retroflexed view of the distal rectum and anal verge was normal and       showed no anal or rectal abnormalities. Impression:           - Diverticulosis in the sigmoid colon.                       - The rectum, sigmoid colon, descending colon,                        transverse colon, ascending colon and cecum are normal.                        Biopsied.                       - The distal rectum and anal verge are normal on  retroflexion view. Recommendation:       - Discharge patient to home.                       - Resume previous diet.                       - Continue present medications.                       - Return to primary care physician as previously                        scheduled.                       - The findings and recommendations were discussed with                        the patient.                       - The findings and  recommendations were discussed with                        the patient's family. Procedure Code(s):    --- Professional ---                       [phone], Colonoscopy, flexible; with biopsy, single or                        multiple Diagnosis Code(s):    --- Professional ---                       R19.7, Diarrhea, unspecified                       K57.30, Diverticulosis of large intestine without                        perforation or abscess without bleeding CPT copyright 2019 American Medical Association. All rights reserved. The codes documented in this report are preliminary and upon coder review may  be revised to meet current compliance requirements.  [name], MD Margretta Sidle B. Bonna Gains MD, MD 01/03/2019 9:53:49 AM This report has been signed electronically. Number of Addenda: 0 Note Initiated On: 01/03/2019 9:19 AM Scope Withdrawal Time: 0 hours 12 minutes 40 seconds  Total Procedure Duration: 0 hours 19 minutes 24 seconds  Estimated Blood Loss: Estimated blood loss: none.      [facility]

## 2019-01-03 NOTE — Anesthesia Postprocedure Evaluation (Signed)
Anesthesia Post Note  Patient: Lawrence Bush  Procedure(s) Performed: COLONOSCOPY WITH PROPOFOL (N/A ) POLYPECTOMY  Patient location during evaluation: PACU Anesthesia Type: General Level of consciousness: awake and alert Pain management: pain level controlled Vital Signs Assessment: post-procedure vital signs reviewed and stable Respiratory status: spontaneous breathing, nonlabored ventilation, respiratory function stable and patient connected to nasal cannula oxygen Cardiovascular status: blood pressure returned to baseline and stable Postop Assessment: no apparent nausea or vomiting Anesthetic complications: no    Laquilla Dault A  Edel Rivero

## 2019-01-03 NOTE — Anesthesia Procedure Notes (Signed)
Procedure Name: MAC Date/Time: 01/03/2019 9:29 AM Performed by: Georga Bora, CRNA Pre-anesthesia Checklist: Patient identified, Emergency Drugs available, Suction available, Patient being monitored and Timeout performed Patient Re-evaluated:Patient Re-evaluated prior to induction Oxygen Delivery Method: Nasal cannula Preoxygenation: Pre-oxygenation with 100% oxygen

## 2019-01-03 NOTE — H&P (Signed)
[name], MD [address], [place], [place], [place], [address] [address], [place], [place], [place], [address] Phone: [phone]  Fax: [phone]  Primary Care Physician:  Burnard Hawthorne, FNP   Pre-Procedure History & Physical: HPI:  Lawrence Bush is a 85 y.o. male is here for a colonoscopy.   Past Medical History:  Diagnosis Date  . Arthritis   . Diverticulitis   . History of colon polyps   . Hyperlipidemia   . Psoriasis   . Wears dentures    partial upper    Past Surgical History:  Procedure Laterality Date  . colon resection     around 85 yrs old; after diverticulitis, had colon resected  . SHOULDER SURGERY Left     Prior to Admission medications   Medication Sig Start Date End Date Taking? Authorizing Provider  bisacodyl (BISACODYL) 5 MG EC tablet Take 2 tablets (10mg ) by mouth the day before your procedure between 1pm-3pm. 12/27/18  Yes Virgel Manifold, MD  calcipotriene-betamethasone (TACLONEX) ointment  05/06/17  Yes [provider]  clobetasol cream (TEMOVATE) 0.05 % APPLY CREAM TOPICALLY TO AFFECTED AREAS (PSORIASIS) ONCE TO TWICE DAILY AS NEEDED AVOID FACE GROIN AXILLA 09/27/18  Yes [provider]  loperamide (IMODIUM) 2 MG capsule Take 2 capsules (4 mg total) by mouth daily. May also take 1 capsule (2 mg total) as needed for diarrhea or loose stools (Take 4mg  as first dose. Then use 2mg  as needed every 2-3 hours with every loose Bowel movement, up to 6 times daily. Do not exceed more than 16 mg in one day.). Do all this for 14 days. 12/27/18 01/10/19 Yes Virgel Manifold, MD  loratadine (CLARITIN) 10 MG tablet Take 10 mg by mouth daily.   Yes [provider]  pravastatin (PRAVACHOL) 40 MG tablet Take 40 mg by mouth daily.   Yes [provider]  Probiotic Product (PROBIOTIC-10 PO) Take 1 tablet by mouth daily.   Yes [provider]  Secukinumab (COSENTYX [place]) Inject into the skin every 30  (thirty) days.    [provider]    Allergies as of 12/27/2018 - Review Complete 12/27/2018  Allergen Reaction Noted  . No known allergies  11/11/2016    Family History  Problem Relation Age of Onset  . Alzheimer's disease Father     Social History   Socioeconomic History  . Marital status: Married    Spouse name: Not on file  . Number of children: Not on file  . Years of education: Not on file  . Highest education level: Not on file  Occupational History  . Not on file  Social Needs  . Financial resource strain: Not on file  . Food insecurity    Worry: Not on file    Inability: Not on file  . Transportation needs    Medical: Not on file    Non-medical: Not on file  Tobacco Use  . Smoking status: Former Smoker    Types: Cigarettes    Quit date: 1995    Years since quitting: 25.7  . Smokeless tobacco: Never Used  . Tobacco comment: quit 2000  Substance and Sexual Activity  . Alcohol use: Yes    Alcohol/week: 6.0 standard drinks    Types: 6 Cans of beer per week    Comment: occasional  . Drug use: No  . Sexual activity: Yes  Lifestyle  . Physical activity    Days per week: Not on file    Minutes  per session: Not on file  . Stress: Not on file  Relationships  . Social Herbalist on phone: Not on file    Gets together: Not on file    Attends religious service: Not on file    Active member of club or organization: Not on file    Attends meetings of clubs or organizations: Not on file    Relationship status: Not on file  . Intimate partner violence    Fear of current or ex partner: Not on file    Emotionally abused: Not on file    Physically abused: Not on file    Forced sexual activity: Not on file  Other Topics Concern  . Not on file  Social History Narrative   Wife is a patient of mine    Review of Systems: See HPI, otherwise negative ROS  Physical Exam: BP 139/67   Pulse 69   Temp 98.1 F (36.7 C) (Temporal)   Resp 18    Ht 5\' 11"  (1.803 m)   Wt 82.6 kg   SpO2 98%   BMI 25.38 kg/m  General:   Alert,  pleasant and cooperative in NAD Head:  Normocephalic and atraumatic. Neck:  Supple; no masses or thyromegaly. Lungs:  Clear throughout to auscultation, normal respiratory effort.    Heart:  +S1, +S2, Regular rate and rhythm, No edema. Abdomen:  Soft, nontender and nondistended. Normal bowel sounds, without guarding, and without rebound.   Neurologic:  Alert and  oriented x4;  grossly normal neurologically.  Impression/Plan: Lawrence Bush is here for a colonoscopy to be performed for diarrhea  Risks, benefits, limitations, and alternatives regarding  colonoscopy have been reviewed with the patient.  Questions have been answered.  All parties agreeable.   Virgel Manifold, MD  01/03/2019, 8:44 AM

## 2019-01-03 NOTE — Transfer of Care (Signed)
Immediate Anesthesia Transfer of Care Note  Patient: Lawrence Bush  Procedure(s) Performed: COLONOSCOPY WITH PROPOFOL (N/A ) POLYPECTOMY  Patient Location: PACU  Anesthesia Type: General  Level of Consciousness: awake, alert  and patient cooperative  Airway and Oxygen Therapy: Patient Spontanous Breathing and Patient connected to supplemental oxygen  Post-op Assessment: Post-op Vital signs reviewed, Patient's Cardiovascular Status Stable, Respiratory Function Stable, Patent Airway and No signs of Nausea or vomiting  Post-op Vital Signs: Reviewed and stable  Complications: No apparent anesthesia complications

## 2019-01-04 ENCOUNTER — Encounter: Payer: Self-pay | Admitting: Gastroenterology

## 2019-01-05 DIAGNOSIS — L400 Psoriasis vulgaris: Secondary | ICD-10-CM | POA: Diagnosis not present

## 2019-01-05 LAB — GI PROFILE, STOOL, PCR

## 2019-01-05 LAB — CALPROTECTIN, FECAL: Calprotectin, Fecal: 281 ug/g — ABNORMAL HIGH (ref 0–120)

## 2019-01-05 LAB — PANCREATIC ELASTASE, FECAL: Pancreatic Elastase, Fecal: >500 ug Elast./g (ref >200)

## 2019-01-09 ENCOUNTER — Other Ambulatory Visit: Payer: Self-pay

## 2019-01-09 ENCOUNTER — Telehealth: Payer: Self-pay

## 2019-01-09 ENCOUNTER — Encounter: Payer: Self-pay | Admitting: Gastroenterology

## 2019-01-09 MED ORDER — PRAVASTATIN SODIUM 40 MG PO TABS: 40.0000 mg | ORAL_TABLET | Freq: Every day | ORAL | 5 refills | Status: DC

## 2019-01-09 NOTE — Telephone Encounter (Signed)
-----   Message from [name], MD sent at 01/09/2019  9:06 AM EDT ----- Path shows collagenous colitis, stool tests only show inflammation no infection .   If patient having severe diarrhea then to see me next week if doing ok then see when Dr Bonna Gains returns   ----- Message ----- From: Shelby Mattocks, [place]: 01/09/2019   9:03 AM EDT To: [name], MD  Good morning,  Patient path and stool test are back and Dr. Bonna Gains was wondering if you could review the results.  Thanks  Lawrence Bush

## 2019-01-09 NOTE — Telephone Encounter (Signed)
Patient states the diarrhea has improved some but he is still having diarrhea. Patient states that he is still having the diarrhea 1-2 times a day but the last 2 nights not at night. Patient states he has a lot of gas and some abdominal pain. Patient states he wants to see someone this week to discuss the next steps would be. Made appointment on 01/10/2019 with Dr. Vicente Males

## 2019-01-10 ENCOUNTER — Ambulatory Visit: Payer: Medicare HMO | Admitting: Gastroenterology

## 2019-01-10 ENCOUNTER — Encounter: Payer: Self-pay | Admitting: Gastroenterology

## 2019-01-10 ENCOUNTER — Other Ambulatory Visit: Payer: Self-pay

## 2019-01-10 VITALS — BP 144/69 | HR 64 | Temp 98.2°F | Ht 71.0 in | Wt 183.6 lb

## 2019-01-10 DIAGNOSIS — K52831 Collagenous colitis: Secondary | ICD-10-CM | POA: Diagnosis not present

## 2019-01-10 MED ORDER — BUDESONIDE 3 MG PO CPEP: 9.0000 mg | ORAL_CAPSULE | Freq: Every day | ORAL | 2 refills | Status: AC

## 2019-01-10 MED ORDER — LOPERAMIDE HCL 2 MG PO CAPS: 2.0000 mg | ORAL_CAPSULE | ORAL | 0 refills | Status: AC | PRN

## 2019-01-10 NOTE — Progress Notes (Signed)
[name] MD, MRCP(U.K) [address]  [place], [place] [address]  Main: [phone]  Fax: [phone]   Primary Care Physician: Burnard Hawthorne, FNP  Primary Gastroenterologist:  Dr. [name]   Discussed results of recent colonoscopy.  HPI: Lawrence Bush is a 85 y.o. male is a patient seen and evaluated by Dr. Bonna Gains last on 12/27/2022 diarrhea.  All began in July 2020.  Prior partial colon resection for diverticulitis in the 90s.  12/27/2018: GI stool PCR: Negative.  Fecal calprotectin elevated at 281 01/03/2019 colonoscopy: Diverticulosis the colon noted.  Random colon biopsies taken demonstrated collagenous colitis.  He is here today to discuss the results of his recent colonoscopy and biopsies.  He did mention that he had been on naproxen which he stopped about 4 weeks back.  He had been taking at least 2 tablets a day for a while for his ankle pain.  He denies use of any PPIs.  No other medications.  At the time he came to the office he was having 6-8 bowel movements per day but presently he has 3 or fewer.  The quantity of stool has also decreased significantly.  Current Outpatient Medications  Medication Sig Dispense Refill  . bisacodyl (BISACODYL) 5 MG EC tablet Take 2 tablets (10mg ) by mouth the day before your procedure between 1pm-3pm. 2 tablet 0  . calcipotriene-betamethasone (TACLONEX) ointment     . clobetasol cream (TEMOVATE) 0.05 % APPLY CREAM TOPICALLY TO AFFECTED AREAS (PSORIASIS) ONCE TO TWICE DAILY AS NEEDED AVOID FACE GROIN AXILLA    . loperamide (IMODIUM) 2 MG capsule Take 2 capsules (4 mg total) by mouth daily. May also take 1 capsule (2 mg total) as needed for diarrhea or loose stools (Take 4mg  as first dose. Then use 2mg  as needed every 2-3 hours with every loose Bowel movement, up to 6 times daily. Do not exceed more than 16 mg in one day.). Do all this for 14 days. 30 capsule 0  . loratadine (CLARITIN) 10 MG tablet Take 10 mg by mouth  daily.    . pravastatin (PRAVACHOL) 40 MG tablet Take 1 tablet (40 mg total) by mouth daily. 30 tablet 5  . Probiotic Product (PROBIOTIC-10 PO) Take 1 tablet by mouth daily.    . Secukinumab (COSENTYX [place]) Inject into the skin every 30 (thirty) days.     No current facility-administered medications for this visit.     Allergies as of 01/10/2019  . (No Known Allergies)    ROS:  General: Negative for anorexia, weight loss, fever, chills, fatigue, weakness. ENT: Negative for hoarseness, difficulty swallowing , nasal congestion. CV: Negative for chest pain, angina, palpitations, dyspnea on exertion, peripheral edema.  Respiratory: Negative for dyspnea at rest, dyspnea on exertion, cough, sputum, wheezing.  GI: See history of present illness. GU:  Negative for dysuria, hematuria, urinary incontinence, urinary frequency, nocturnal urination.  Endo: Negative for unusual weight change.    Physical Examination:   There were no vitals taken for this visit.  General: Well-nourished, well-developed in no acute distress.  Eyes: No icterus. Conjunctivae pink. Mouth: Oropharyngeal mucosa moist and pink , no lesions erythema or exudate. Lungs: Clear to auscultation bilaterally. Non-labored. Heart: Regular rate and rhythm, no murmurs rubs or gallops.  Abdomen: Bowel sounds are normal, nontender, nondistended, no hepatosplenomegaly or masses, no abdominal bruits or hernia , no rebound or guarding.   Extremities: No lower extremity edema. No clubbing or deformities. Neuro: Alert and oriented x 3.  Grossly intact. Skin: Warm and dry, no jaundice.   Psych: Alert and cooperative, normal mood and affect.   Imaging Studies: Ct Abdomen Pelvis Wo Contrast  Result Date: 12/16/2018 CLINICAL DATA:  Diarrhea, concern for diverticulitis EXAM: CT ABDOMEN AND PELVIS WITHOUT CONTRAST TECHNIQUE: Multidetector CT imaging of the abdomen and pelvis was performed following the standard protocol without IV contrast.  Oral enteric contrast was administered. COMPARISON:  None. FINDINGS: Lower chest: No acute abnormality. Mild subpleural fibrotic change in the included lung bases. Coronary artery calcifications. Hepatobiliary: No solid liver abnormality is seen. No gallstones, gallbladder wall thickening, or biliary dilatation. Pancreas: Unremarkable. No pancreatic ductal dilatation or surrounding inflammatory changes. Spleen: Normal in size without significant abnormality. Adrenals/Urinary Tract: Adrenal glands are unremarkable. Kidneys are normal, without renal calculi, solid lesion, or hydronephrosis. Bladder is unremarkable. Stomach/Bowel: Stomach is within normal limits. Appendix appears normal. No evidence of bowel wall thickening, distention, or inflammatory changes. Occasional descending colonic diverticulosis. Evidence of prior sigmoid resection and anastomosis. Vascular/Lymphatic: Aortic atherosclerosis. No enlarged abdominal or pelvic lymph nodes. Reproductive: Prostatomegaly. Other: Fat containing right greater than left inguinal hernias. No abdominopelvic ascites. Musculoskeletal: No acute or significant osseous findings. IMPRESSION: 1. Occasional descending colonic diverticulosis. Evidence of prior sigmoid resection and anastomosis. No acute inflammatory findings to suggest diverticulitis. 2.  Prostatomegaly. 3.  Fat containing right greater than left inguinal hernias. 4.  Other chronic and incidental findings as detailed above. Electronically Signed   By: Eddie Candle M.D.   On: 12/16/2018 14:43    Assessment and Plan:   Lawrence Bush is a 85 y.o. y/o male here to discuss recent colonoscopy performed for chronic diarrhea.  Random colon biopsies demonstrated collagenous colitis.  He is on secukinumab which has been associated with ulcerative colitis but not with collagenous colitis.  As he has 3 or fewer bowel movements per day he would be described as having mild collagenous colitis.  I gave him the options that  we could treat him with Imodium and see if it resolves.  It is very likely that this episode has been triggered by NSAID use.  It should usually resolve by stopping the offending drug.  I also gave him the option to treat with budesonide for 6 to 8 weeks.  He said that he would like the prescription of budesonide and if it does not get better in a week or 10 days he  will commence on it but he will inform my office of the same when he decides to do so.  He is well aware that he should not commence himself on any NSAIDs over-the-counter.  I will set him up for a telephone visit in 2 weeks to see how he is doing.  He will be provided patient information of the same    Dr [name]  MD,MRCP [facility]) Follow up in 2 weeks

## 2019-01-16 ENCOUNTER — Telehealth: Payer: Self-pay

## 2019-01-16 NOTE — Telephone Encounter (Signed)
Patient's information was sent to charge correction for the removal of the no show fee.

## 2019-01-16 NOTE — Telephone Encounter (Signed)
Copied from [place] [id number]. Topic: General - Other >> Jan 16, 2019  9:15 AM Pauline Good wrote: Reason for CRM: pt want a call back from [facility] and said he wasn't going to tell me why. He demanded me to transfer him to the office and I stated that wasn't protocol.

## 2019-01-16 NOTE — Telephone Encounter (Signed)
Patient received bill for no show visit for 12/02/18, patient could not come into office due to diarrhea symptoms and could not pass COVID screening. He tried to call billing but did not receive resolution .

## 2019-01-25 ENCOUNTER — Ambulatory Visit: Payer: Medicare HMO | Admitting: Gastroenterology

## 2019-01-31 ENCOUNTER — Ambulatory Visit: Payer: Medicare HMO | Admitting: Gastroenterology

## 2019-01-31 DIAGNOSIS — L400 Psoriasis vulgaris: Secondary | ICD-10-CM | POA: Diagnosis not present

## 2019-02-21 ENCOUNTER — Ambulatory Visit: Payer: Medicare HMO | Admitting: Gastroenterology

## 2019-02-28 DIAGNOSIS — L400 Psoriasis vulgaris: Secondary | ICD-10-CM | POA: Diagnosis not present

## 2019-03-28 ENCOUNTER — Ambulatory Visit: Payer: Medicare HMO | Admitting: Gastroenterology

## 2019-03-28 DIAGNOSIS — L400 Psoriasis vulgaris: Secondary | ICD-10-CM | POA: Diagnosis not present

## 2019-04-20 ENCOUNTER — Ambulatory Visit (INDEPENDENT_AMBULATORY_CARE_PROVIDER_SITE_OTHER): Payer: Medicare HMO

## 2019-04-20 ENCOUNTER — Other Ambulatory Visit: Payer: Self-pay

## 2019-04-20 VITALS — Ht 71.0 in | Wt 183.0 lb

## 2019-04-20 DIAGNOSIS — Z0000 Encounter for general adult medical examination without abnormal findings: Secondary | ICD-10-CM | POA: Diagnosis not present

## 2019-04-20 NOTE — Patient Instructions (Addendum)
  Lawrence Bush , Thank you for taking time to come for your Medicare Wellness Visit. I appreciate your ongoing commitment to your health goals. Please review the following plan we discussed and let me know if I can assist you in the future.   These are the goals we discussed: Goals    . Follow up with Primary Care Provider     As needed       This is a list of the screening recommended for you and due dates:  Health Maintenance  Topic Date Due  . Tetanus Vaccine  09/19/1952  . Pneumonia vaccines (1 of 2 - PCV13) 09/18/2000  . Flu Shot  Completed

## 2019-04-20 NOTE — Progress Notes (Addendum)
Subjective:   Lawrence Bush is a 86 y.o. male who presents for Medicare Annual/Subsequent preventive examination.  Review of Systems:  No ROS.  Medicare Wellness Virtual Visit.  Visual/audio telehealth visit, UTA vital signs.   Ht/Wt provided.  See social history for additional risk factors.   Cardiac Risk Factors include: advanced age (>11men, >36 women);male gender     Objective:    Vitals: Ht 5\' 11"  (1.803 m)   Wt 183 lb (83 kg)   BMI 25.52 kg/m   Body mass index is 25.52 kg/m.  Advanced Directives 04/20/2019 01/03/2019  Does Patient Have a Medical Advance Directive? Yes Yes  Type of Paramedic of [place];Living will  Does patient want to make changes to medical advance directive? No - Patient declined -  Copy of [place] in Chart? No - copy requested No - copy requested    Tobacco Social History   Tobacco Use  Smoking Status Former Smoker  . Types: Cigarettes  . Quit date: 65  . Years since quitting: 26.0  Smokeless Tobacco Never Used  Tobacco Comment   quit 2000     Counseling given: Not Answered Comment: quit 2000   Clinical Intake:  Pre-visit preparation completed: Yes        Diabetes: No  How often do you need to have someone help you when you read instructions, pamphlets, or other written materials from your doctor or pharmacy?: 1 - Never  Interpreter Needed?: No     Past Medical History:  Diagnosis Date  . Arthritis   . Diverticulitis   . History of colon polyps   . Hyperlipidemia   . Psoriasis   . Wears dentures    partial upper   Past Surgical History:  Procedure Laterality Date  . colon resection     around 86 yrs old; after diverticulitis, had colon resected  . COLONOSCOPY WITH PROPOFOL N/A 01/03/2019   Procedure: COLONOSCOPY WITH PROPOFOL;  Surgeon: Virgel Manifold, MD;  Location: [facility];  Service: Endoscopy;  Laterality: N/A;  .  POLYPECTOMY  01/03/2019   Procedure: POLYPECTOMY;  Surgeon: Virgel Manifold, MD;  Location: [place] [facility];  Service: Endoscopy;;  . SHOULDER SURGERY Left    Family History  Problem Relation Age of Onset  . Alzheimer's disease Father    Social History   Socioeconomic History  . Marital status: Married    Spouse name: Not on file  . Number of children: Not on file  . Years of education: Not on file  . Highest education level: Not on file  Occupational History  . Not on file  Tobacco Use  . Smoking status: Former Smoker    Types: Cigarettes    Quit date: 1995    Years since quitting: 26.0  . Smokeless tobacco: Never Used  . Tobacco comment: quit 2000  Substance and Sexual Activity  . Alcohol use: Yes    Alcohol/week: 6.0 standard drinks    Types: 6 Cans of beer per week    Comment: occasional  . Drug use: No  . Sexual activity: Yes  Other Topics Concern  . Not on file  Social History Narrative   Wife is a patient of mine   Scientist, physiological Strain: [place]   . Difficulty of Paying Living Expenses: Not hard at all  Food Insecurity: No Food Insecurity  . Worried About Charity fundraiser in the  Last Year: Never true  . Ran Out of Food in the Last Year: Never true  Transportation Needs: No Transportation Needs  . Lack of Transportation (Medical): No  . Lack of Transportation (Non-Medical): No  Physical Activity: Sufficiently Active  . Days of Exercise per Week: 3 days  . Minutes of Exercise per Session: 60 min  Stress: No Stress Concern Present  . Feeling of Stress : Not at all  Social Connections: Not Isolated  . Frequency of Communication with Friends and Family: More than three times a week  . Frequency of Social Gatherings with Friends and Family: Once a week  . Attends Religious Services: 1 to 4 times per year  . Active Member of Clubs or Organizations: Yes  . Attends Archivist Meetings: 1 to 4 times per  year  . Marital Status: Married    Outpatient Encounter Medications as of 04/20/2019  Medication Sig  . bisacodyl (BISACODYL) 5 MG EC tablet Take 2 tablets (10mg ) by mouth the day before your procedure between 1pm-3pm.  . calcipotriene-betamethasone (TACLONEX) ointment   . clobetasol cream (TEMOVATE) 0.05 % APPLY CREAM TOPICALLY TO AFFECTED AREAS (PSORIASIS) ONCE TO TWICE DAILY AS NEEDED AVOID FACE GROIN AXILLA  . loratadine (CLARITIN) 10 MG tablet Take 10 mg by mouth daily.  . pravastatin (PRAVACHOL) 40 MG tablet Take 1 tablet (40 mg total) by mouth daily.  . Probiotic Product (PROBIOTIC-10 PO) Take 1 tablet by mouth daily.  . Secukinumab (COSENTYX [place]) Inject into the skin every 30 (thirty) days.   No facility-administered encounter medications on file as of 04/20/2019.    Activities of Daily Living In your present state of health, do you have any difficulty performing the following activities: 04/20/2019 01/03/2019  Hearing? Y N  Comment Difficulty hearing conversational tones -  Vision? N N  Difficulty concentrating or making decisions? N N  Walking or climbing stairs? N N  Dressing or bathing? N N  Doing errands, shopping? N -  Preparing Food and eating ? N -  Using the Toilet? N -  In the past six months, have you accidently leaked urine? N -  Do you have problems with loss of bowel control? N -  Managing your Medications? N -  Managing your Finances? N -  Housekeeping or managing your Housekeeping? N -  Some recent data might be hidden    Patient Care Team: Burnard Hawthorne, FNP as PCP - General (Family Medicine)   Assessment:   This is a routine wellness examination for [name].  Nurse connected with patient 04/20/19 at  9:30 AM EST by a telephone enabled telemedicine application and verified that I am speaking with the correct person using two identifiers. Patient stated full name and DOB. Patient gave permission to continue with virtual visit. Patient's location was at home  and Nurse's location was at [place] office.   Patient is alert and oriented x3. Patient denies difficulty focusing or concentrating.  Health Maintenance Due: TDAP vaccine- discussed; to be completed with doctor in visit or local pharmacy. Pneumonia vaccine- discussed; patient notes this has been received.   See completed HM at the end of note.   Eye: Visual acuity not assessed. Virtual visit. Followed by their ophthalmologist.  Dental: Visits every 6 months.    Hearing: Demonstrates normal hearing during visit.  Safety:  Patient feels safe at home- yes Patient does have smoke detectors at home- yes Patient does wear sunscreen or protective clothing when in direct sunlight - yes  Patient does wear seat belt when in a moving vehicle - yes Patient drives- yes Adequate lighting in walkways free from debris- yes Grab bars and handrails used as appropriate- yes Ambulates with an assistive device- no Cell phone on person when ambulating outside of the home- yes  Social: Alcohol intake - yes      Smoking history- former   Smokers in home? none Illicit drug use? none  Medication: Taking as directed and without issues.  Self managed - yes   Covid-19: Precautions and sickness symptoms discussed. Wears mask, social distancing, hand hygiene as appropriate.   Activities of Daily Living Patient denies needing assistance with: household chores, feeding themselves, getting from bed to chair, getting to the toilet, bathing/showering, dressing, managing money, or preparing meals.   Discussed the importance of a healthy diet, water intake and the benefits of aerobic exercise.   Physical activity- walking every other day 45 minutes. Golf.  Diet:  Regular Water: good intake Caffeine: 1 cup coffee  Other Providers Patient Care Team: Burnard Hawthorne, FNP as PCP - General (Family Medicine)  Exercise Activities and Dietary recommendations Current Exercise Habits: Home exercise  routine, Type of exercise: walking, Time (Minutes): 60, Frequency (Times/Week): 4, Weekly Exercise (Minutes/Week): 240, Intensity: Mild  Goals    . Follow up with Primary Care Provider     As needed       Fall Risk Fall Risk  04/20/2019 11/10/2016 05/26/2016  Falls in the past year? 0 No No  Follow up Falls prevention discussed - -   Timed Get Up and Go Performed: no, virtual visit  Depression Screen PHQ 2/9 Scores 04/20/2019 11/10/2016 05/26/2016  PHQ - 2 Score 0 0 0    Cognitive Function     6CIT Screen 04/20/2019  What Year? 0 points  What month? 0 points  What time? 0 points  Count back from 20 0 points  Months in reverse 0 points  Repeat phrase 2 points  Total Score 2    Immunization History  Administered Date(s) Administered  . Influenza, High Dose Seasonal PF 01/05/2018, 01/13/2019   Screening Tests Health Maintenance  Topic Date Due  . TETANUS/TDAP  09/19/1954  . PNA vac Low Risk Adult (1 of 2 - PCV13) 09/18/2000  . INFLUENZA VACCINE  Completed       Plan:   Keep all routine maintenance appointments.   Medicare Attestation I have personally reviewed: The patient's medical and social history Their use of alcohol, tobacco or illicit drugs Their current medications and supplements The patient's functional ability including ADLs,fall risks, home safety risks, cognitive, and hearing and visual impairment Diet and physical activities Evidence for depression    I have reviewed and discussed with patient certain preventive protocols, quality metrics, and best practice recommendations.   Varney Biles, LPN  [id number]  Agree with plan. Mable Paris, NP

## 2019-05-02 DIAGNOSIS — L400 Psoriasis vulgaris: Secondary | ICD-10-CM | POA: Diagnosis not present

## 2019-05-05 ENCOUNTER — Ambulatory Visit: Payer: Medicare HMO | Attending: Internal Medicine

## 2019-05-05 DIAGNOSIS — Z23 Encounter for immunization: Secondary | ICD-10-CM

## 2019-05-05 NOTE — Progress Notes (Signed)
   Covid-19 Vaccination Clinic  Name:  Cylan Foucault    MRN: [id number] DOB: 12/07/1933  05/05/2019  Mr. Tito was observed post Covid-19 immunization for 15 minutes without incidence. He was provided with Vaccine Information Sheet and instruction to access the V-Safe system.   Mr. Sibaja was instructed to call 911 with any severe reactions post vaccine: Marland Kitchen Difficulty breathing  . Swelling of your face and throat  . A fast heartbeat  . A bad rash all over your body  . Dizziness and weakness    Immunizations Administered    Name Date Dose VIS Date Route   Pfizer COVID-19 Vaccine 05/05/2019  7:08 PM 0.3 mL 03/24/2019 Intramuscular   Manufacturer: [place]   Lot: [id number]   [place]: [id number]

## 2019-05-16 ENCOUNTER — Ambulatory Visit (INDEPENDENT_AMBULATORY_CARE_PROVIDER_SITE_OTHER): Payer: Medicare HMO | Admitting: Gastroenterology

## 2019-05-16 DIAGNOSIS — L400 Psoriasis vulgaris: Secondary | ICD-10-CM | POA: Diagnosis not present

## 2019-05-16 DIAGNOSIS — K52831 Collagenous colitis: Secondary | ICD-10-CM

## 2019-05-16 NOTE — Progress Notes (Signed)
Lawrence Bush , MD [address]  [place]  [place], [place] [address]  Main: [phone]  Fax: [phone]   Primary Care Physician: Burnard Hawthorne, FNP  Virtual Visit via Telephone Note  I connected with patient on 05/16/19 at  1:15 PM EST by telephone and verified that I am speaking with the correct person using two identifiers.   I discussed the limitations, risks, security and privacy concerns of performing an evaluation and management service by telephone and the availability of in person appointments. I also discussed with the patient that there may be a patient responsible charge related to this service. The patient expressed understanding and agreed to proceed.  Location of Patient: Home Location of Provider: Home Persons involved: Patient and provider only   History of Present Illness:  Follow-up for collagenous colitis  HPI: Lawrence Bush is a 86 y.o. male    Summary of history :  Patient is of follow-up of Dr. Michele Mcalpine.  I have last seen him on 01/10/2019.  I have seen him for diarrhea.  All began in July 2020.  Prior partial colon resection for diverticulitis in the 90s.  12/27/2018: GI stool PCR: Negative.  Fecal calprotectin elevated at 281 01/03/2019 colonoscopy: Diverticulosis the colon noted.  Random colon biopsies taken demonstrated collagenous colitis.  Prior to the colonoscopy he had been on naproxen.  He had been taking 2 tablets a day for his ankle pain.  No PPI usage.  No other medications.  When I discussed the results with him on 01/10/2019 his diarrhea had significantly decreased.   Interval history   01/10/2019-05/16/2019  01/22/2019: Commenced on budesonide 9 mg.  Gradually been weaning down and presently taking.   Free of diarrhea last 2 months , has stopped the steroids .stopped ibuprofen, taking pravastatin at night .     Current Outpatient Medications  Medication Sig Dispense Refill  . bisacodyl (BISACODYL) 5 MG EC tablet Take 2  tablets (10mg ) by mouth the day before your procedure between 1pm-3pm. 2 tablet 0  . calcipotriene-betamethasone (TACLONEX) ointment     . clobetasol cream (TEMOVATE) 0.05 % APPLY CREAM TOPICALLY TO AFFECTED AREAS (PSORIASIS) ONCE TO TWICE DAILY AS NEEDED AVOID FACE GROIN AXILLA    . loratadine (CLARITIN) 10 MG tablet Take 10 mg by mouth daily.    . pravastatin (PRAVACHOL) 40 MG tablet Take 1 tablet (40 mg total) by mouth daily. 30 tablet 5  . Probiotic Product (PROBIOTIC-10 PO) Take 1 tablet by mouth daily.    . Secukinumab (COSENTYX [place]) Inject into the skin every 30 (thirty) days.     No current facility-administered medications for this visit.    Allergies as of 05/16/2019  . (No Known Allergies)    Review of Systems:    All systems reviewed and negative except where noted in HPI.   Observations/Objective:  Labs: CMP     Component Value Date/Time   NA 142 12/01/2018 0815   K 4.7 12/01/2018 0815   CL 103 12/01/2018 0815   CO2 24 12/01/2018 0815   GLUCOSE 93 12/01/2018 0815   GLUCOSE 119 (H) 11/10/2016 1454   BUN 23 12/01/2018 0815   CREATININE 1.30 (H) 12/01/2018 0815   CALCIUM 9.6 12/01/2018 0815   PROT 7.3 12/01/2018 0815   ALBUMIN 4.9 (H) 12/01/2018 0815   AST 21 12/01/2018 0815   ALT 20 12/01/2018 0815   ALKPHOS 66 12/01/2018 0815   BILITOT 0.5 12/01/2018 0815   GFRNONAA 50 (L) 12/01/2018 0815  GFRAA 58 (L) 12/01/2018 0815   Lab Results  Component Value Date   WBC 7.6 12/01/2018   HGB 12.9 (L) 12/01/2018   HCT 37.7 12/01/2018   MCV 90 12/01/2018   PLT 322 12/01/2018    Imaging Studies: No results found.  Assessment and Plan:   Lawrence Bush is a 86 y.o. y/o male here to follow-up for collagenous colitis.  He is on secukinumab which has been associated with ulcerative colitis but not with collagenous colitis.  He had been on NSAIDs which she has stopped.  I treated him with budesonide and he completed treatment about 2 months back and since then he has had  no diarrhea.  He is aware that he should call us right away if the symptoms of diarrhea return.  I discussed the assessment and treatment plan with the patient. The patient was provided an opportunity to ask questions and all were answered. The patient agreed with the plan and demonstrated an understanding of the instructions.   The patient was advised to call back or seek an in-person evaluation if the symptoms worsen or if the condition fails to improve as anticipated.  I provided 12 minutes of non-face-to-face time during this encounter.  Dr Lawrence Bellows MD,MRCP [facility]) Gastroenterology/Hepatology Pager: [phone]   Speech recognition software was used to dictate this note.

## 2019-05-18 DIAGNOSIS — L400 Psoriasis vulgaris: Secondary | ICD-10-CM | POA: Diagnosis not present

## 2019-05-23 ENCOUNTER — Ambulatory Visit: Payer: Medicare HMO | Attending: Internal Medicine

## 2019-05-23 DIAGNOSIS — Z23 Encounter for immunization: Secondary | ICD-10-CM | POA: Insufficient documentation

## 2019-05-23 NOTE — Progress Notes (Signed)
   Covid-19 Vaccination Clinic  Name:  Lawrence Bush    MRN: [id number] DOB: [birth year]  05/23/2019  Mr. Leveck was observed post Covid-19 immunization for 15 minutes without incidence. He was provided with Vaccine Information Sheet and instruction to access the V-Safe system.   Mr. Martineau was instructed to call 911 with any severe reactions post vaccine: Marland Kitchen Difficulty breathing  . Swelling of your face and throat  . A fast heartbeat  . A bad rash all over your body  . Dizziness and weakness    Immunizations Administered    Name Date Dose VIS Date Route   Pfizer COVID-19 Vaccine 05/23/2019  9:49 AM 0.3 mL 03/24/2019 Intramuscular   Manufacturer: [place]   Lot: [id number]   [place]: [id number]

## 2019-05-24 DIAGNOSIS — L400 Psoriasis vulgaris: Secondary | ICD-10-CM | POA: Diagnosis not present

## 2019-05-31 DIAGNOSIS — L400 Psoriasis vulgaris: Secondary | ICD-10-CM | POA: Diagnosis not present

## 2019-06-05 DIAGNOSIS — L400 Psoriasis vulgaris: Secondary | ICD-10-CM | POA: Diagnosis not present

## 2019-06-08 DIAGNOSIS — L400 Psoriasis vulgaris: Secondary | ICD-10-CM | POA: Diagnosis not present

## 2019-06-13 DIAGNOSIS — L720 Epidermal cyst: Secondary | ICD-10-CM | POA: Diagnosis not present

## 2019-06-13 DIAGNOSIS — L400 Psoriasis vulgaris: Secondary | ICD-10-CM | POA: Diagnosis not present

## 2019-06-19 DIAGNOSIS — L400 Psoriasis vulgaris: Secondary | ICD-10-CM | POA: Diagnosis not present

## 2019-06-20 ENCOUNTER — Other Ambulatory Visit: Payer: Self-pay

## 2019-06-20 DIAGNOSIS — L400 Psoriasis vulgaris: Secondary | ICD-10-CM

## 2019-06-20 NOTE — Progress Notes (Signed)
xtrac

## 2019-06-23 DIAGNOSIS — L400 Psoriasis vulgaris: Secondary | ICD-10-CM | POA: Diagnosis not present

## 2019-06-29 ENCOUNTER — Ambulatory Visit: Payer: Medicare HMO

## 2019-06-29 ENCOUNTER — Other Ambulatory Visit: Payer: Self-pay

## 2019-06-29 DIAGNOSIS — L400 Psoriasis vulgaris: Secondary | ICD-10-CM | POA: Diagnosis not present

## 2019-06-29 NOTE — Progress Notes (Signed)
Total Surface Area: 84cm2 Total Energy: 223.19J  Pt tolerated treatment well.

## 2019-07-03 ENCOUNTER — Telehealth: Payer: Self-pay

## 2019-07-03 NOTE — Telephone Encounter (Signed)
error 

## 2019-07-06 ENCOUNTER — Other Ambulatory Visit: Payer: Self-pay

## 2019-07-06 ENCOUNTER — Ambulatory Visit: Payer: Medicare HMO

## 2019-07-06 DIAGNOSIS — L400 Psoriasis vulgaris: Secondary | ICD-10-CM | POA: Diagnosis not present

## 2019-07-06 MED ORDER — SECUKINUMAB 150 MG/ML ~~LOC~~ SOAJ
300.0000 mg | SUBCUTANEOUS | Status: AC
  Administered 2019-07-06 – 2020-01-01 (×2): 300 mg via SUBCUTANEOUS

## 2019-07-06 NOTE — Progress Notes (Signed)
Total Surface Area: 108cm2 Total Energy: 329.94J  Patient also injected with Cosentyx 300mg  SQ into Left and Right upper arm. Patient tolerated well.

## 2019-07-13 ENCOUNTER — Ambulatory Visit: Payer: Medicare HMO

## 2019-07-13 DIAGNOSIS — H5213 Myopia, bilateral: Secondary | ICD-10-CM | POA: Diagnosis not present

## 2019-07-18 ENCOUNTER — Ambulatory Visit: Payer: Medicare HMO

## 2019-07-18 ENCOUNTER — Other Ambulatory Visit: Payer: Self-pay

## 2019-07-18 DIAGNOSIS — L400 Psoriasis vulgaris: Secondary | ICD-10-CM | POA: Diagnosis not present

## 2019-07-18 NOTE — Progress Notes (Signed)
Total Surface Area: 116cm2 Total Energy: 354.[place]

## 2019-07-25 ENCOUNTER — Ambulatory Visit: Payer: Medicare HMO

## 2019-07-25 ENCOUNTER — Other Ambulatory Visit: Payer: Self-pay

## 2019-07-25 DIAGNOSIS — L400 Psoriasis vulgaris: Secondary | ICD-10-CM | POA: Diagnosis not present

## 2019-07-25 NOTE — Progress Notes (Signed)
Total Surface Area: 144cm2 Total Energy: 439.92J

## 2019-08-01 ENCOUNTER — Ambulatory Visit: Payer: Medicare HMO

## 2019-08-01 ENCOUNTER — Other Ambulatory Visit: Payer: Self-pay

## 2019-08-01 DIAGNOSIS — L400 Psoriasis vulgaris: Secondary | ICD-10-CM

## 2019-08-01 MED ORDER — SECUKINUMAB (300 MG DOSE) 150 MG/ML ~~LOC~~ SOAJ
300.0000 mg | Freq: Once | SUBCUTANEOUS | Status: AC
  Administered 2019-08-01: 11:00:00 300 mg via SUBCUTANEOUS

## 2019-08-01 NOTE — Progress Notes (Signed)
Total Surface Area: 100cm2 Total Energy: 305.50J  Cosentyx 300mg  injected SQ into both the left and right upper arm. Patient tolerated well.  [id number] Exp: JUN 2022

## 2019-08-03 ENCOUNTER — Telehealth: Payer: Self-pay | Admitting: Family

## 2019-08-03 NOTE — Telephone Encounter (Signed)
Patient's wife called & stated that husband's right foot was numb when he was driving. This has been going on for awhile now. Patient's physical therapist suggested that he see a neurologist as opposed to podiatry as patient had thought. Patient's wife said that she had seen a change in husband lately & he was more tired. She felt that he needed blood work done as well.

## 2019-08-03 NOTE — Telephone Encounter (Signed)
Pt's wife wants a referral for neurology. Please advise

## 2019-08-07 NOTE — Telephone Encounter (Signed)
Pt returned your call and would like a call back .. °

## 2019-08-07 NOTE — Telephone Encounter (Signed)
Call pt Seems like a lot going on Is pt and wife okay with scheduling a visit with [place]?

## 2019-08-07 NOTE — Telephone Encounter (Signed)
I spoke with patient's wife & she was really hoping to get referral sooner or get in to see you sooner than 5/7. I scheduled in first available that patient could make. She feels he really needs to be seen by neurologist soon.

## 2019-08-08 ENCOUNTER — Ambulatory Visit: Payer: Medicare HMO

## 2019-08-08 ENCOUNTER — Other Ambulatory Visit: Payer: Self-pay

## 2019-08-08 DIAGNOSIS — L400 Psoriasis vulgaris: Secondary | ICD-10-CM

## 2019-08-08 NOTE — Telephone Encounter (Signed)
We had a cancellation for tomorrow 4/28 at 3p. Patient was willing to take that appointment. He has been scheduled.

## 2019-08-08 NOTE — Telephone Encounter (Signed)
Please look at schedule again and see if any cancellations May offer an appt with kim as well

## 2019-08-08 NOTE — Progress Notes (Signed)
Total Surface Area: 124cm2 Total Energy: 378.82J

## 2019-08-09 ENCOUNTER — Other Ambulatory Visit: Payer: Self-pay

## 2019-08-09 ENCOUNTER — Encounter: Payer: Self-pay | Admitting: Family

## 2019-08-09 ENCOUNTER — Ambulatory Visit (INDEPENDENT_AMBULATORY_CARE_PROVIDER_SITE_OTHER): Payer: Medicare HMO | Admitting: Family

## 2019-08-09 VITALS — BP 138/68 | HR 70 | Temp 97.1°F | Ht 71.0 in | Wt 185.0 lb

## 2019-08-09 DIAGNOSIS — R3911 Hesitancy of micturition: Secondary | ICD-10-CM

## 2019-08-09 DIAGNOSIS — G629 Polyneuropathy, unspecified: Secondary | ICD-10-CM

## 2019-08-09 MED ORDER — PRAVASTATIN SODIUM 40 MG PO TABS: 40.0000 mg | ORAL_TABLET | Freq: Every day | ORAL | 3 refills | Status: AC

## 2019-08-09 NOTE — Assessment & Plan Note (Signed)
New, etiology nonspecific at this time.  Pending lab evaluation for metabolic reasons.  Agree with abstaining from driving due to safety concerns.  Advised over-the-counter capsaicin.  Referral to neurology.

## 2019-08-09 NOTE — Patient Instructions (Signed)
Nonfasting lab at [facility] as discussed.  Referral to  Neurology Let us know if you dont hear back within a week in regards to an appointment being scheduled.

## 2019-08-09 NOTE — Progress Notes (Signed)
Subjective:    Patient ID: Lawrence Bush, male    DOB: [birth year], 86 y.o.   MRN: [id number]  CC: Lawrence Bush is a 86 y.o. male who presents today for an acute visit.    HPI: Complains of right foot 'decreased sensation', some improved.  States it is not numb. No tingling, burning.  Accompanied by wife today Has been seeing physical therapy and advise a consult with neurology. Most noticeable driving and not driving right now to be safe.   Feet are not cold. No swelling, wounds, discoloration in feet, calf pain, back pain, balance problems, vision changes, HA.   Psoriasis- on cosentyx for 2 years. Follows with dermatology  Plays golf, works in yard. No CP, sob.   Sleeps 8 hours per night. Endorses nocturia.   Planning to move to [facility]       HISTORY:  Past Medical History:  Diagnosis Date  . Arthritis   . Diverticulitis   . History of colon polyps   . Hyperlipidemia   . Psoriasis   . Wears dentures    partial upper   Past Surgical History:  Procedure Laterality Date  . colon resection     around 86 yrs old; after diverticulitis, had colon resected  . COLONOSCOPY WITH PROPOFOL N/A 01/03/2019   Procedure: COLONOSCOPY WITH PROPOFOL;  Surgeon: Virgel Manifold, MD;  Location: [facility];  Service: Endoscopy;  Laterality: N/A;  . POLYPECTOMY  01/03/2019   Procedure: POLYPECTOMY;  Surgeon: Virgel Manifold, MD;  Location: [facility];  Service: Endoscopy;;  . SHOULDER SURGERY Left    Family History  Problem Relation Age of Onset  . Alzheimer's disease Father     Allergies: Patient has no known allergies. Current Outpatient Medications on File Prior to Visit  Medication Sig Dispense Refill  . bisacodyl (BISACODYL) 5 MG EC tablet Take 2 tablets (10mg ) by mouth the day before your procedure between 1pm-3pm. 2 tablet 0  . calcipotriene-betamethasone (TACLONEX) ointment     . clobetasol cream (TEMOVATE) 0.05 % APPLY CREAM TOPICALLY TO  AFFECTED AREAS (PSORIASIS) ONCE TO TWICE DAILY AS NEEDED AVOID FACE GROIN AXILLA    . loratadine (CLARITIN) 10 MG tablet Take 10 mg by mouth daily.    . Probiotic Product (PROBIOTIC-10 PO) Take 1 tablet by mouth daily.    . Secukinumab (COSENTYX [place]) Inject into the skin every 30 (thirty) days.     Current Facility-Administered Medications on File Prior to Visit  Medication Dose Route Frequency Provider Last Rate Last Admin  . Secukinumab SOAJ 300 mg  300 mg Subcutaneous See admin instructions Brendolyn Patty, MD   300 mg at 07/06/19 1013    Social History   Tobacco Use  . Smoking status: Former Smoker    Types: Cigarettes    Quit date: 1995    Years since quitting: 26.3  . Smokeless tobacco: Never Used  . Tobacco comment: quit 2000  Substance Use Topics  . Alcohol use: Yes    Alcohol/week: 6.0 standard drinks    Types: 6 Cans of beer per week    Comment: occasional  . Drug use: No    Review of Systems  Constitutional: Negative for chills and fever.  Eyes: Negative for visual disturbance.  Respiratory: Negative for cough.   Cardiovascular: Negative for chest pain, palpitations and leg swelling.  Gastrointestinal: Negative for nausea and vomiting.  Musculoskeletal: Negative for arthralgias and back pain.  Skin: Negative for color change.  Neurological: Negative for  dizziness, speech difficulty, weakness, numbness and headaches.      Objective:    BP 138/68   Pulse 70   Temp (!) 97.1 F (36.2 C) (Temporal)   Ht 5\' 11"  (1.803 m)   Wt 185 lb (83.9 kg)   SpO2 97%   BMI 25.80 kg/m    Physical Exam Vitals reviewed.  Constitutional:      Appearance: He is well-developed.  Cardiovascular:     Rate and Rhythm: Regular rhythm.     Heart sounds: Normal heart sounds.     Comments: Palpable right pedal pulse.  Extremity is warm.  Hair growth is present.  No lower extremity edema bilaterally.  No wounds. Pulmonary:     Effort: Pulmonary effort is normal. No respiratory  distress.     Breath sounds: Normal breath sounds. No wheezing, rhonchi or rales.  Musculoskeletal:     Right lower leg: No edema.     Left lower leg: No edema.  Feet:     Comments: Decreased sensation on microfilament over right great toe dorsal aspect Skin:    General: Skin is warm and dry.  Neurological:     Mental Status: He is alert.  Psychiatric:        Speech: Speech normal.        Behavior: Behavior normal.        Assessment & Plan:   Problem List Items Addressed This Visit      Nervous and Auditory   Neuropathy - Primary    New, etiology nonspecific at this time.  Pending lab evaluation for metabolic reasons.  Agree with abstaining from driving due to safety concerns.  Advised over-the-counter capsaicin.  Referral to neurology.      Relevant Orders   TSH   CBC with Differential/Platelet   Comprehensive metabolic panel   Lipid panel   PSA   Hemoglobin A1c   B12 and Folate Panel   Ambulatory referral to Neurology     Other   Urinary hesitancy   Relevant Orders   PSA        I am having Tania Ade maintain his loratadine, Probiotic Product (PROBIOTIC-10 PO), calcipotriene-betamethasone, clobetasol cream, bisacodyl, Secukinumab (COSENTYX [place]), and pravastatin. We will continue to administer Secukinumab.   No orders of the defined types were placed in this encounter.   Return precautions given.   Risks, benefits, and alternatives of the medications and treatment plan prescribed today were discussed, and patient expressed understanding.   Education regarding symptom management and diagnosis given to patient on AVS.  Continue to follow with Burnard Hawthorne, FNP for routine health maintenance.   Tania Ade and I agreed with plan.   Mable Paris, FNP

## 2019-08-10 DIAGNOSIS — R3911 Hesitancy of micturition: Secondary | ICD-10-CM | POA: Diagnosis not present

## 2019-08-10 DIAGNOSIS — G629 Polyneuropathy, unspecified: Secondary | ICD-10-CM | POA: Diagnosis not present

## 2019-08-11 ENCOUNTER — Encounter: Payer: Self-pay | Admitting: Neurology

## 2019-08-11 LAB — TSH: TSH: 4.17 u[IU]/mL (ref 0.450–4.500)

## 2019-08-11 LAB — B12 AND FOLATE PANEL
Folate: 19 ng/mL (ref >3.0)
Vitamin B-12: 402 pg/mL (ref 232–1245)

## 2019-08-11 LAB — COMPREHENSIVE METABOLIC PANEL
ALT: 18 IU/L (ref 0–44)
AST: 23 IU/L (ref 0–40)
Albumin/Globulin Ratio: 1.5 (ref 1.2–2.2)
Albumin: 4.3 g/dL (ref 3.6–4.6)
Alkaline Phosphatase: 69 IU/L (ref 39–117)
BUN/Creatinine Ratio: 13 (ref 10–24)
BUN: 19 mg/dL (ref 8–27)
Bilirubin Total: 0.4 mg/dL (ref 0.0–1.2)
CO2: 24 mmol/L (ref 20–29)
Calcium: 9.2 mg/dL (ref 8.6–10.2)
Chloride: 105 mmol/L (ref 96–106)
Creatinine, Ser: 1.52 mg/dL — ABNORMAL HIGH (ref 0.76–1.27)
GFR calc Af Amer: 48 mL/min/{1.73_m2} — ABNORMAL LOW (ref >59)
GFR calc non Af Amer: 42 mL/min/{1.73_m2} — ABNORMAL LOW (ref >59)
Globulin, Total: 2.9 g/dL (ref 1.5–4.5)
Glucose: 101 mg/dL — ABNORMAL HIGH (ref 65–99)
Potassium: 5.4 mmol/L — ABNORMAL HIGH (ref 3.5–5.2)
Sodium: 141 mmol/L (ref 134–144)
Total Protein: 7.2 g/dL (ref 6.0–8.5)

## 2019-08-11 LAB — CBC WITH DIFFERENTIAL/PLATELET
Basophils Absolute: 0 10*3/uL (ref 0.0–0.2)
Basos: 1 %
EOS (ABSOLUTE): 0.3 10*3/uL (ref 0.0–0.4)
Eos: 4 %
Hematocrit: 37.1 % — ABNORMAL LOW (ref 37.5–51.0)
Hemoglobin: 12.6 g/dL — ABNORMAL LOW (ref 13.0–17.7)
Immature Grans (Abs): 0 10*3/uL (ref 0.0–0.1)
Immature Granulocytes: 0 %
Lymphocytes Absolute: 2.5 10*3/uL (ref 0.7–3.1)
Lymphs: 39 %
MCH: 31 pg (ref 26.6–33.0)
MCHC: 34 g/dL (ref 31.5–35.7)
MCV: 91 fL (ref 79–97)
Monocytes Absolute: 0.8 10*3/uL (ref 0.1–0.9)
Monocytes: 13 %
Neutrophils Absolute: 2.8 10*3/uL (ref 1.4–7.0)
Neutrophils: 43 %
Platelets: 322 10*3/uL (ref 150–450)
RBC: 4.07 x10E6/uL — ABNORMAL LOW (ref 4.14–5.80)
RDW: 13.2 % (ref 11.6–15.4)
WBC: 6.5 10*3/uL (ref 3.4–10.8)

## 2019-08-11 LAB — HEMOGLOBIN A1C
Est. average glucose Bld gHb Est-mCnc: 123 mg/dL
Hgb A1c MFr Bld: 5.9 % — ABNORMAL HIGH (ref 4.8–5.6)

## 2019-08-11 LAB — PSA: Prostate Specific Ag, Serum: 5.4 ng/mL — ABNORMAL HIGH (ref 0.0–4.0)

## 2019-08-11 LAB — LIPID PANEL
Chol/HDL Ratio: 3.9 ratio (ref 0.0–5.0)
Cholesterol, Total: 209 mg/dL — ABNORMAL HIGH (ref 100–199)
HDL: 54 mg/dL (ref >39)
LDL Chol Calc (NIH): 133 mg/dL — ABNORMAL HIGH (ref 0–99)
Triglycerides: 124 mg/dL (ref 0–149)
VLDL Cholesterol Cal: 22 mg/dL (ref 5–40)

## 2019-08-14 ENCOUNTER — Other Ambulatory Visit: Payer: Self-pay

## 2019-08-14 ENCOUNTER — Other Ambulatory Visit
Admission: RE | Admit: 2019-08-14 | Discharge: 2019-08-14 | Disposition: A | Discharge status: Home / Self Care | Payer: Medicare HMO | Source: Ambulatory Visit | Attending: Family | Admitting: Family

## 2019-08-14 ENCOUNTER — Other Ambulatory Visit: Payer: Self-pay | Admitting: Family

## 2019-08-14 ENCOUNTER — Encounter: Payer: Self-pay | Admitting: Family

## 2019-08-14 DIAGNOSIS — R899 Unspecified abnormal finding in specimens from other organs, systems and tissues: Secondary | ICD-10-CM

## 2019-08-14 DIAGNOSIS — E7800 Pure hypercholesterolemia, unspecified: Secondary | ICD-10-CM

## 2019-08-14 LAB — BASIC METABOLIC PANEL
Anion gap: 7 (ref 5–15)
BUN: 20 mg/dL (ref 8–23)
CO2: 28 mmol/L (ref 22–32)
Calcium: 9.1 mg/dL (ref 8.9–10.3)
Chloride: 105 mmol/L (ref 98–111)
Creatinine, Ser: 1.25 mg/dL — ABNORMAL HIGH (ref 0.61–1.24)
GFR calc Af Amer: >60 mL/min (ref >60)
GFR calc non Af Amer: 53 mL/min — ABNORMAL LOW (ref >60)
Glucose, Bld: 74 mg/dL (ref 70–99)
Potassium: 4.9 mmol/L (ref 3.5–5.1)
Sodium: 140 mmol/L (ref 135–145)

## 2019-08-14 MED ORDER — PRAVASTATIN SODIUM 20 MG PO TABS: 20.0000 mg | ORAL_TABLET | Freq: Every day | ORAL | 3 refills | Status: AC

## 2019-08-17 ENCOUNTER — Other Ambulatory Visit: Payer: Self-pay

## 2019-08-17 ENCOUNTER — Ambulatory Visit: Payer: Medicare HMO

## 2019-08-17 DIAGNOSIS — L400 Psoriasis vulgaris: Secondary | ICD-10-CM

## 2019-08-17 NOTE — Progress Notes (Signed)
Total Surface Area: 136cm2 Total Energy: 415.[place]

## 2019-08-18 ENCOUNTER — Ambulatory Visit: Payer: Medicare HMO | Admitting: Family

## 2019-08-24 ENCOUNTER — Ambulatory Visit: Payer: Medicare HMO

## 2019-08-29 ENCOUNTER — Ambulatory Visit: Payer: Medicare HMO

## 2019-09-25 ENCOUNTER — Ambulatory Visit: Payer: Medicare HMO

## 2019-09-25 ENCOUNTER — Other Ambulatory Visit: Payer: Self-pay

## 2019-09-25 DIAGNOSIS — L400 Psoriasis vulgaris: Secondary | ICD-10-CM

## 2019-09-25 MED ORDER — SECUKINUMAB (300 MG DOSE) 150 MG/ML ~~LOC~~ SOAJ
300.0000 mg | Freq: Once | SUBCUTANEOUS | Status: AC
  Administered 2019-09-25: 300 mg via SUBCUTANEOUS

## 2019-09-25 NOTE — Progress Notes (Signed)
Total Surface Area: 232cm2 Total Energy: 354.50  Patient also here for monthly Cosentyx injection. Patient injected into left and right side of lower abdomen. Patient tolerated well.  LOT: [id number] EXP: 09/2020

## 2019-10-16 ENCOUNTER — Other Ambulatory Visit: Payer: Self-pay

## 2019-10-16 ENCOUNTER — Ambulatory Visit (INDEPENDENT_AMBULATORY_CARE_PROVIDER_SITE_OTHER): Payer: Medicare HMO

## 2019-10-16 DIAGNOSIS — L400 Psoriasis vulgaris: Secondary | ICD-10-CM | POA: Diagnosis not present

## 2019-10-16 NOTE — Progress Notes (Signed)
Total Surface Area: 324cm2 Total Energy: 324.00J

## 2019-10-24 ENCOUNTER — Other Ambulatory Visit: Payer: Self-pay

## 2019-10-24 ENCOUNTER — Ambulatory Visit: Payer: Medicare HMO

## 2019-10-24 DIAGNOSIS — L400 Psoriasis vulgaris: Secondary | ICD-10-CM

## 2019-10-24 MED ORDER — SECUKINUMAB (300 MG DOSE) 150 MG/ML ~~LOC~~ SOAJ
300.0000 mg | Freq: Once | SUBCUTANEOUS | Status: AC
  Administered 2019-10-24: 300 mg via SUBCUTANEOUS

## 2019-10-24 NOTE — Progress Notes (Signed)
Total Surface Area: 336cm2 Total Energy: 336.00J  Patient also here for 4 week injection of Cosentyx.  Cosentyx injected into patients right and left thigh. Patient did right leg injection and I did left leg injection. Patient tolerated well.  LOT: [id number] EXP: May 2022

## 2019-11-01 ENCOUNTER — Ambulatory Visit: Payer: Medicare HMO | Admitting: Neurology

## 2019-11-07 ENCOUNTER — Ambulatory Visit: Payer: Medicare HMO

## 2019-11-07 ENCOUNTER — Other Ambulatory Visit: Payer: Self-pay

## 2019-11-07 DIAGNOSIS — L400 Psoriasis vulgaris: Secondary | ICD-10-CM

## 2019-11-07 NOTE — Progress Notes (Signed)
Total Surface Area: 308cm2 Total Energy: 503T

## 2019-11-14 ENCOUNTER — Ambulatory Visit: Payer: Medicare HMO

## 2019-11-21 ENCOUNTER — Ambulatory Visit: Payer: Medicare HMO

## 2019-11-21 ENCOUNTER — Other Ambulatory Visit: Payer: Self-pay

## 2019-11-21 DIAGNOSIS — L400 Psoriasis vulgaris: Secondary | ICD-10-CM

## 2019-11-21 MED ORDER — SECUKINUMAB (300 MG DOSE) 150 MG/ML ~~LOC~~ SOAJ
300.0000 mg | Freq: Once | SUBCUTANEOUS | Status: AC
  Administered 2019-11-21: 300 mg via SUBCUTANEOUS

## 2019-11-21 NOTE — Progress Notes (Signed)
Patient here today for Xtrac and Cosentyx injections.   Cosentyx 300mg  injected into patients right and left upper arm. Patient tolerated well.  MMO:[id number] EXP:08/2020  Xtrac Treatment: Total Surface Area: 188cm2 Total Energy: 188J

## 2019-11-29 ENCOUNTER — Other Ambulatory Visit: Payer: Self-pay

## 2019-11-29 ENCOUNTER — Ambulatory Visit: Payer: Medicare HMO

## 2019-11-29 DIAGNOSIS — L400 Psoriasis vulgaris: Secondary | ICD-10-CM | POA: Diagnosis not present

## 2019-11-29 NOTE — Progress Notes (Signed)
Total Surface Area: 228cm2 Total Energy: 228.00J

## 2020-01-01 ENCOUNTER — Telehealth: Payer: Self-pay

## 2020-01-01 ENCOUNTER — Other Ambulatory Visit: Payer: Self-pay

## 2020-01-01 ENCOUNTER — Ambulatory Visit: Payer: Medicare HMO

## 2020-01-01 DIAGNOSIS — L400 Psoriasis vulgaris: Secondary | ICD-10-CM

## 2020-01-01 NOTE — Telephone Encounter (Signed)
I am not aware of any contraindication of dental work while on [employer].  If the dentist is concerned, Lawrence Bush could hold off on taking his next monthly dose and schedule at that time for the dental procedure, then restart it once his mouth is healed.

## 2020-01-01 NOTE — Progress Notes (Signed)
Patient here today for xtrac laser and 4 week Cosentyx injections.  Cosentyx pens injected SQ into both right and left upper thighs. Patient tolerated well.  LOT: [id number] EXP: 12/22  Total Surface Area: 184cm2 Total Energy: 184.00J

## 2020-01-01 NOTE — Telephone Encounter (Signed)
Patient came in today for Xtrac treatment and Cosentyx injections. Patient currently has a tooth that needs to be extracted due to a abscess/infection. His current dentist will not see patient to do this treatment due to him being on Cosentyx and referred him to oral surgeon but that office does not take his insurance. He is frustrated with his current dentist office, as he has never heard this causing him issues in the past. Should he be worried with dental work being on Cosentyx?

## 2020-01-01 NOTE — Telephone Encounter (Signed)
Patient advised of information per Dr. Nicole Kindred.

## 2020-01-15 ENCOUNTER — Other Ambulatory Visit: Payer: Self-pay | Admitting: Dermatology

## 2020-01-16 ENCOUNTER — Ambulatory Visit (INDEPENDENT_AMBULATORY_CARE_PROVIDER_SITE_OTHER): Payer: Medicare HMO

## 2020-01-16 ENCOUNTER — Other Ambulatory Visit: Payer: Self-pay

## 2020-01-16 DIAGNOSIS — L400 Psoriasis vulgaris: Secondary | ICD-10-CM | POA: Diagnosis not present

## 2020-01-16 NOTE — Progress Notes (Signed)
Total Surface Area: 204cm2 Total Energy: 204.00J

## 2020-01-29 ENCOUNTER — Telehealth: Payer: Self-pay

## 2020-01-29 NOTE — Telephone Encounter (Signed)
Called [employer] Pt Asst. And scheduled next delivery of Cosentyx to the office on Wednesday 01/31/20.

## 2020-03-27 ENCOUNTER — Ambulatory Visit: Payer: Medicare HMO

## 2020-03-27 ENCOUNTER — Other Ambulatory Visit: Payer: Self-pay

## 2020-03-27 ENCOUNTER — Ambulatory Visit (INDEPENDENT_AMBULATORY_CARE_PROVIDER_SITE_OTHER): Payer: Medicare HMO | Admitting: Dermatology

## 2020-03-27 DIAGNOSIS — L409 Psoriasis, unspecified: Secondary | ICD-10-CM

## 2020-03-27 DIAGNOSIS — L400 Psoriasis vulgaris: Secondary | ICD-10-CM | POA: Diagnosis not present

## 2020-03-27 DIAGNOSIS — D692 Other nonthrombocytopenic purpura: Secondary | ICD-10-CM

## 2020-03-27 NOTE — Progress Notes (Signed)
   Follow-Up Visit   Subjective  Lawrence Bush is a 86 y.o. male who presents for the following: Psoriasis (9 months f/u on Psoriasis, treating with Cosentyx injection every month, Betamethasone/Calcipotriene ointment, pt also having Xtrac laser treatments, [place] office like he suppose to).  The following portions of the chart were reviewed this encounter and updated as appropriate:   Tobacco  Allergies  Meds  Problems  Med Hx  Surg Hx  Fam Hx     Review of Systems:  No other skin or systemic complaints except as noted in HPI or Assessment and Plan.  Objective  Well appearing patient in no apparent distress; mood and affect are within normal limits.  A focused examination was performed including face,exts,trunk . Relevant physical exam findings are noted in the Assessment and Plan.  Objective  Right Ankle - Anterior: Mod confluent guttate plaques on the L leg, less significant on the R leg    Assessment & Plan  Psoriasis Right Ankle - Anterior  Psoriasis - severe on systemic "biologic" treatment injections.  Psoriasis is a chronic non-curable, but treatable genetic/hereditary disease that may have other systemic features affecting other organ systems such as joints (Psoriatic Arthritis).  It is linked with heart disease, inflammatory bowel disease, non-alcoholic fatty liver disease, and depression. Significant skin psoriasis and/or psoriatic arthritis may have significant symptoms and affects activities of daily activity and often benefits from systemic "biologic" injection treatments.  These "biologic" treatments have some potential side effects including immunosuppression and require pre-treatment laboratory screening and periodic laboratory monitoring and periodic in person evaluation and monitoring by the attending dermatologist physician.   Cont Cosentyx injection once a month Pt was given Cosentyx injection today during his  Xtrac laser treatment with Estill Bamberg. Cont Betamethasone Calcipotriene ointment qd-bid  Topical steroids (such as triamcinolone, fluocinolone, fluocinonide, mometasone, clobetasol, halobetasol, betamethasone, hydrocortisone) can cause thinning and lightening of the skin if they are used for too long in the same area. Your physician has selected the right strength medicine for your problem and area affected on the body. Please use your medication only as directed by your physician to prevent side effects.    Other Related Procedures QuantiFERON-TB Gold Plus  Purpura - Chronic; persistent and recurrent.  Treatable, but not curable. - Violaceous macules and patches - Benign - Related to age, sun damage and/or use of blood thinners - Observe - Can use OTC arnica containing moisturizer such as Dermend Bruise Formula if desired - Call for worsening or other concerns  Return in about 6 months (around 09/25/2020) for Psoriasis.  IMarye Round, CMA, am acting as scribe for Sarina Ser, MD .  Documentation: I have reviewed the above documentation for accuracy and completeness, and I agree with the above.  Sarina Ser, MD

## 2020-03-27 NOTE — Patient Instructions (Signed)
Topical steroids (such as triamcinolone, fluocinolone, fluocinonide, mometasone, clobetasol, halobetasol, betamethasone, hydrocortisone) can cause thinning and lightening of the skin if they are used for too long in the same area. Your physician has selected the right strength medicine for your problem and area affected on the body. Please use your medication only as directed by your physician to prevent side effects.   . 

## 2020-03-28 MED ORDER — SECUKINUMAB (300 MG DOSE) 150 MG/ML ~~LOC~~ SOAJ
300.0000 mg | Freq: Once | SUBCUTANEOUS | Status: AC
  Administered 2020-03-28: 09:00:00 300 mg via SUBCUTANEOUS

## 2020-03-28 NOTE — Progress Notes (Addendum)
Total Surface Area: 140cm2 Total Energy: 70.00   Patient also here for Cosentyx injections.  Cosentyx 300mg  (2 pens) injected SQ into both left and right arms. Patient tolerated well.   LOT: [id number] EXP: MAR 2023

## 2020-03-28 NOTE — Addendum Note (Signed)
Addended by: Johnsie Kindred R on: 03/28/2020 08:40 AM   Modules accepted: Orders

## 2020-03-29 LAB — QUANTIFERON-TB GOLD PLUS
QuantiFERON Mitogen Value: 2.09 IU/mL
QuantiFERON Nil Value: 0.01 IU/mL
QuantiFERON TB1 Ag Value: 0.02 IU/mL
QuantiFERON TB2 Ag Value: 0.03 IU/mL
QuantiFERON-TB Gold Plus: NEGATIVE

## 2020-04-01 ENCOUNTER — Telehealth: Payer: Self-pay

## 2020-04-01 NOTE — Telephone Encounter (Signed)
Patient advised labs ok, continue Cosentyx, JS 

## 2020-04-01 NOTE — Telephone Encounter (Signed)
-----   Message from Ralene Bathe, MD sent at 03/30/2020  4:36 PM EST ----- TB test (quantiferon gold) = negative = normal May continue Cosentyx Keep recommended follow up appts

## 2020-04-02 ENCOUNTER — Encounter: Payer: Self-pay | Admitting: Dermatology

## 2020-04-22 ENCOUNTER — Ambulatory Visit: Payer: Medicare HMO

## 2020-05-21 ENCOUNTER — Other Ambulatory Visit: Payer: Self-pay

## 2020-05-21 ENCOUNTER — Ambulatory Visit: Payer: Medicare HMO

## 2020-05-21 DIAGNOSIS — L400 Psoriasis vulgaris: Secondary | ICD-10-CM | POA: Diagnosis not present

## 2020-05-21 MED ORDER — SECUKINUMAB (300 MG DOSE) 150 MG/ML ~~LOC~~ SOAJ
300.0000 mg | Freq: Once | SUBCUTANEOUS | Status: AC
  Administered 2020-05-21: 300 mg via SUBCUTANEOUS

## 2020-05-21 NOTE — Progress Notes (Signed)
Patient here for Cosentyx injections and Xtrac laser treatment.  Cosentyx 300mg  injected into both left and right upper arm. Patient tolerated well.  LOT: [id number] EXP: Jun 2023  Xtrac Laser: Total Surface Area: 208cm2 Total Energy: 104.00J

## 2020-06-04 ENCOUNTER — Other Ambulatory Visit: Payer: Self-pay

## 2020-06-04 MED ORDER — CLOBETASOL PROPIONATE 0.05 % EX CREA: TOPICAL_CREAM | CUTANEOUS | 1 refills | Status: DC

## 2020-06-04 NOTE — Progress Notes (Signed)
Clobetasol Cream RF.

## 2020-10-09 ENCOUNTER — Other Ambulatory Visit: Payer: Self-pay | Admitting: Family

## 2020-11-05 ENCOUNTER — Other Ambulatory Visit: Payer: Self-pay | Admitting: Dermatology

## 2020-12-18 ENCOUNTER — Ambulatory Visit: Payer: Medicare HMO | Admitting: Dermatology

## 2020-12-18 ENCOUNTER — Ambulatory Visit: Payer: Medicare HMO

## 2020-12-18 ENCOUNTER — Other Ambulatory Visit: Payer: Self-pay

## 2020-12-18 DIAGNOSIS — L409 Psoriasis, unspecified: Secondary | ICD-10-CM | POA: Diagnosis not present

## 2020-12-18 DIAGNOSIS — Z79899 Other long term (current) drug therapy: Secondary | ICD-10-CM

## 2020-12-18 MED ORDER — COSENTYX SENSOREADY (300 MG) 150 MG/ML ~~LOC~~ SOAJ: 300.0000 mg | SUBCUTANEOUS | 5 refills | Status: DC

## 2020-12-18 NOTE — Patient Instructions (Signed)
Reviewed risks of biologics including immunosuppression, infections, injection site reaction, and failure to improve condition. Goal is control of skin condition, not cure.  Some older biologics such as Humira and Enbrel may slightly increase risk of malignancy and may worsen congestive heart failure. The use of biologics requires long term medication management, including periodic office visits and monitoring of blood work.  If you have any questions or concerns for your doctor, please call our main line at [phone] and press option 4 to reach your doctor's medical assistant. If no one answers, please leave a voicemail as directed and we will return your call as soon as possible. Messages left after 4 pm will be answered the following business day.   You may also send [place] a message via [place]. We typically respond to MyChart messages within 1-2 business days.  For prescription refills, please ask your pharmacy to contact our office. Our fax number is [phone].  If you have an urgent issue when the clinic is closed that cannot wait until the next business day, you can page your doctor at the number below.    Please note that while we do our best to be available for urgent issues outside of office hours, we are not available 24/7.   If you have an urgent issue and are unable to reach [place], you may choose to seek medical care at your doctor's office, retail clinic, urgent care center, or emergency room.  If you have a medical emergency, please immediately call 911 or go to the emergency department.  Pager Numbers  - Dr. Nehemiah Massed: [phone]  - Dr. Laurence Ferrari: [phone]  - Dr. Nicole Kindred: [phone]  In the event of inclement weather, please call our main line at [phone] for an update on the status of any delays or closures.  Dermatology Medication Tips: Please keep the boxes that topical medications come in in order to help keep track of the instructions about where and how to use  these. Pharmacies typically print the medication instructions only on the boxes and not directly on the medication tubes.   If your medication is too expensive, please contact our office at [phone] option 4 or send [place] a message through [place].   We are unable to tell what your co-pay for medications will be in advance as this is different depending on your insurance coverage. However, we may be able to find a substitute medication at lower cost or fill out paperwork to get insurance to cover a needed medication.   If a prior authorization is required to get your medication covered by your insurance company, please allow [place] 1-2 business days to complete this process.  Drug prices often vary depending on where the prescription is filled and some pharmacies may offer cheaper prices.  The website www.goodrx.com contains coupons for medications through different pharmacies. The prices here do not account for what the cost may be with help from insurance (it may be cheaper with your insurance), but the website can give you the price if you did not use any insurance.  - You can print the associated coupon and take it with your prescription to the pharmacy.  - You may also stop by our office during regular business hours and pick up a GoodRx coupon card.  - If you need your prescription sent electronically to a different pharmacy, notify our office through [facility] or by phone at [phone] option 4.

## 2020-12-18 NOTE — Progress Notes (Signed)
   Follow-Up Visit   Subjective  Lawrence Bush is a 87 y.o. male who presents for the following: Psoriasis (Legs, arms, elbows. Patient is controlled on Cosentyx injections and Calcipotriene-Betamethasone Dipropionate Ointment. ). No side effects. Has had XTRAC in past, but has moved to [facility], so is unable to do that anymore.  He tries to get natural sunlight.  The following portions of the chart were reviewed this encounter and updated as appropriate:       Review of Systems:  No other skin or systemic complaints except as noted in HPI or Assessment and Plan.  Objective  Well appearing patient in no apparent distress; mood and affect are within normal limits.  A focused examination was performed including face, arms, legs. Relevant physical exam findings are noted in the Assessment and Plan.  legs, arms Pink patches with mild scale on bilateral lower legs and thighs, L forearm; pink scaly papule on the left upper arm.   Assessment & Plan  Psoriasis legs, arms  Improved with Cosentyx injections. Not clear but pt is satisfied  Psoriasis - severe on systemic "biologic" treatment injections.  Psoriasis is a chronic non-curable, but treatable genetic/hereditary disease that may have other systemic features affecting other organ systems such as joints (Psoriatic Arthritis).  It is linked with heart disease, inflammatory bowel disease, non-alcoholic fatty liver disease, and depression. Significant skin psoriasis and/or psoriatic arthritis may have significant symptoms and affects activities of daily activity and often benefits from systemic "biologic" injection treatments.  These "biologic" treatments have some potential side effects including immunosuppression and require pre-treatment laboratory screening and periodic laboratory monitoring and periodic in person evaluation and monitoring by the attending dermatologist physician (long term medication management).    Continue Cosentyx  injections q 4 weeks 5Rf. Sent to [employer] [employer] by [employer]. Continue Calcipotriene-Betamethasone Ointment (Taclonex) QHS prn. Avoid face, groin, axilla.   Labs reviewed from 08/29/20. TB negative 03/27/20.  Lab for Quantiferon-TB  Gold Plus given to patient. He will have PCP,  Piedad Climes, MD, add to labs at next appt.   Reviewed risks of biologics including immunosuppression, infections, injection site reaction, and failure to improve condition. Goal is control of skin condition, not cure.  Some older biologics such as Humira and Enbrel may slightly increase risk of malignancy and may worsen congestive heart failure. The use of biologics requires long term medication management, including periodic office visits and monitoring of blood work.  Topical steroids (such as triamcinolone, fluocinolone, fluocinonide, mometasone, clobetasol, halobetasol, betamethasone, hydrocortisone) can cause thinning and lightening of the skin if they are used for too long in the same area. Your physician has selected the right strength medicine for your problem and area affected on the body. Please use your medication only as directed by your physician to prevent side effects.    Secukinumab, 300 MG Dose, (COSENTYX SENSOREADY, 300 MG,) 150 MG/ML SOAJ - legs, arms Inject 300 mg into the skin every 28 (twenty-eight) days. For maintenance.  Related Procedures QuantiFERON-TB Gold Plus  Return in about 6 months (around 06/17/2021) for Psoriasis.  IJamesetta Orleans, CMA, am acting as scribe for Brendolyn Patty, MD .  Documentation: I have reviewed the above documentation for accuracy and completeness, and I agree with the above.  Brendolyn Patty MD

## 2021-03-14 IMAGING — CT CT ABD-PELV W/O CM
2 of 4 series · 16 of 46 positions shown, 18 images · non-contrast
Comparison: None.

CLINICAL DATA: Diarrhea, concern for diverticulitis

EXAM:
CT ABDOMEN AND PELVIS WITHOUT CONTRAST
TECHNIQUE: Multidetector CT imaging of the abdomen and pelvis was performed
following the standard protocol without IV contrast. Oral enteric
contrast was administered.

[Series 2: routine abd/pel wo · axial · 0.80mm/px · z∈[-244,+156]mm · 13 of 88 slices shown, 15 images]
[im 4/88  soft-tissue]
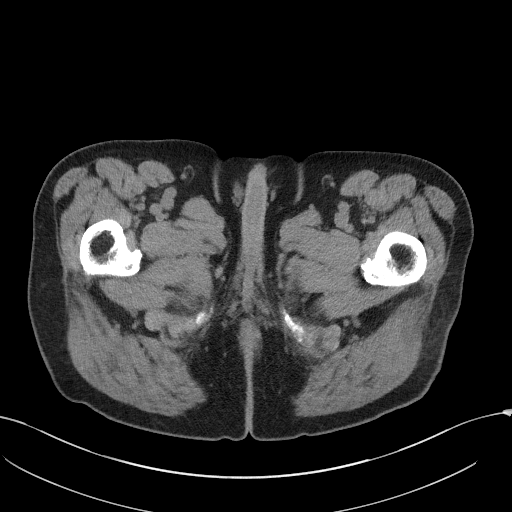
[im 4/88  bone]
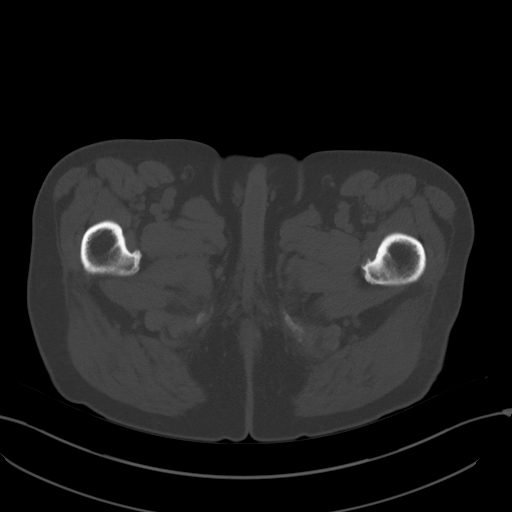
[im 11/88  soft-tissue]
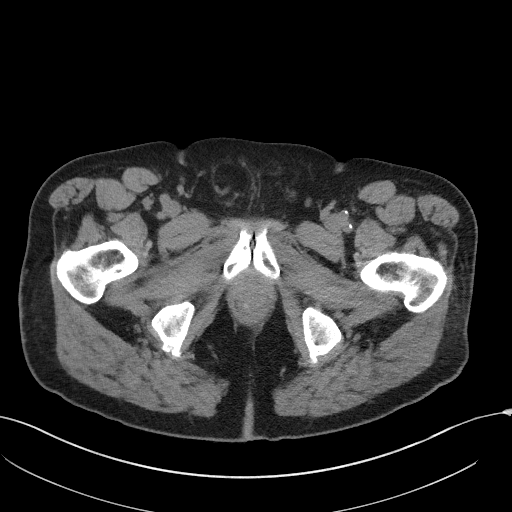
[im 19/88  soft-tissue]
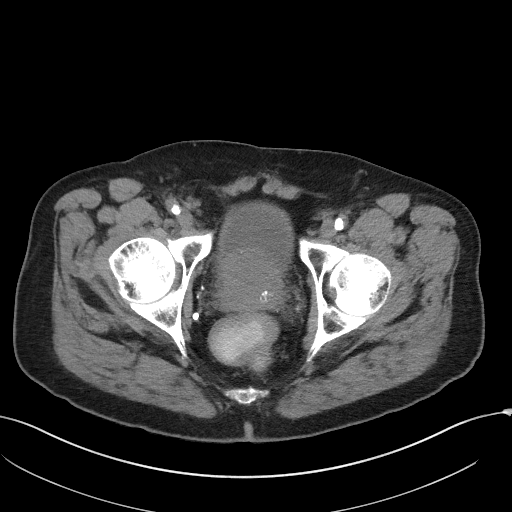
[im 26/88  soft-tissue]
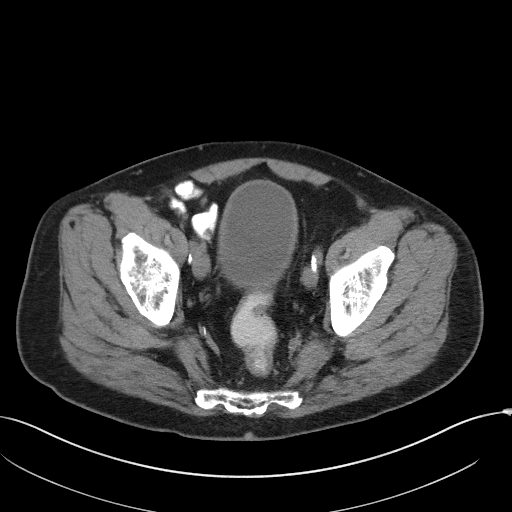
[im 30/88  soft-tissue]
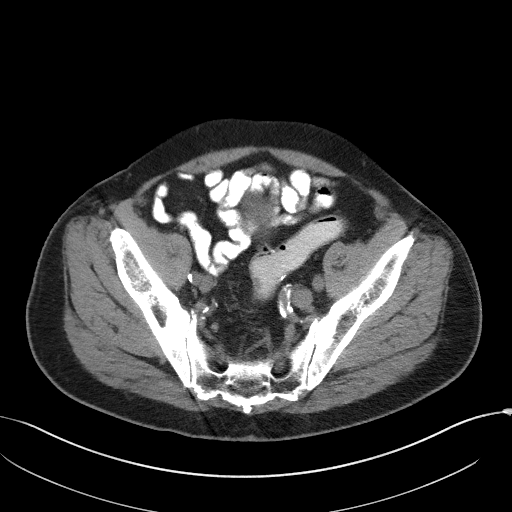
[im 37/88  soft-tissue]
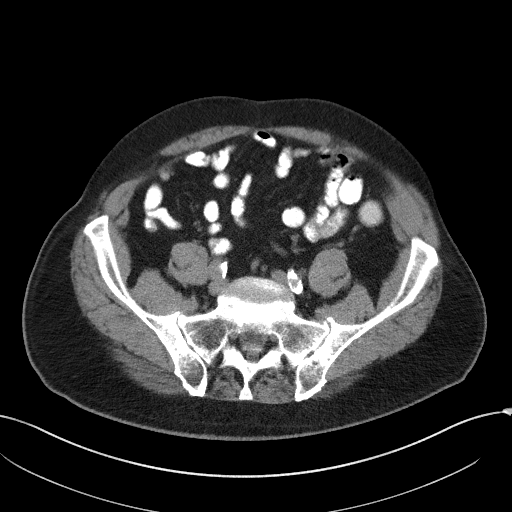
[im 44/88  soft-tissue]
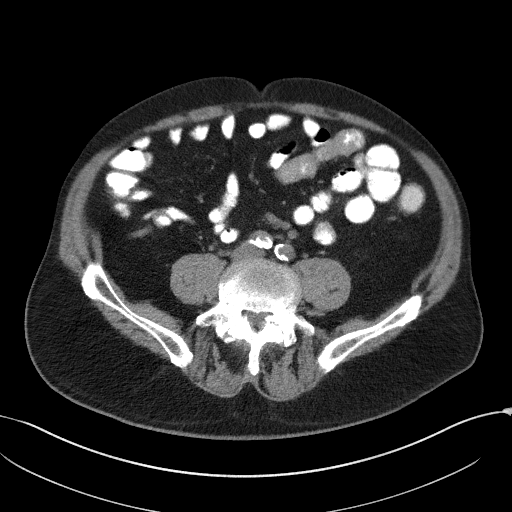
[im 51/88  soft-tissue]
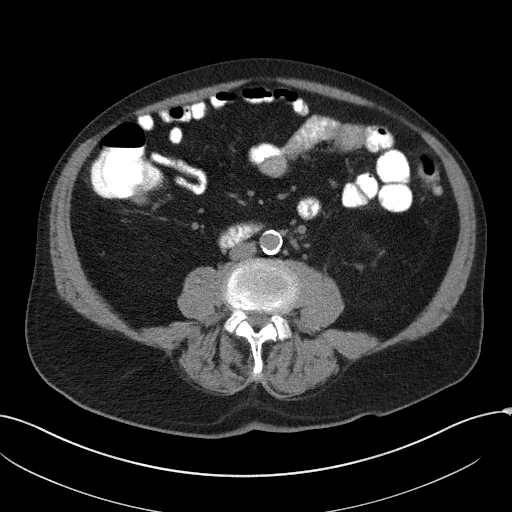
[im 59/88  soft-tissue]
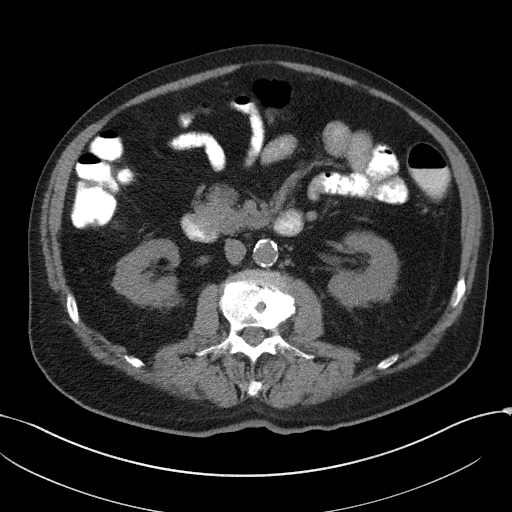
[im 59/88  bone]
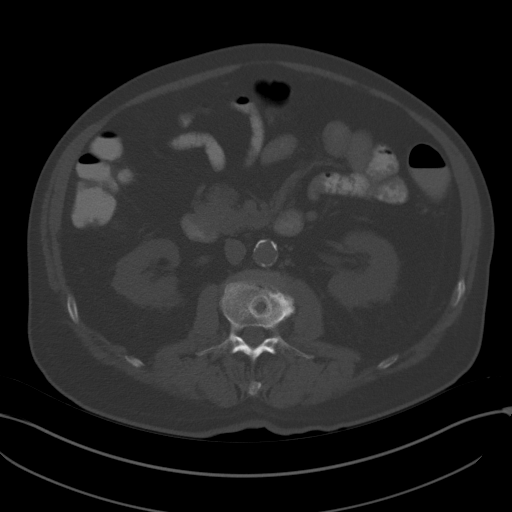
[im 62/88  soft-tissue]
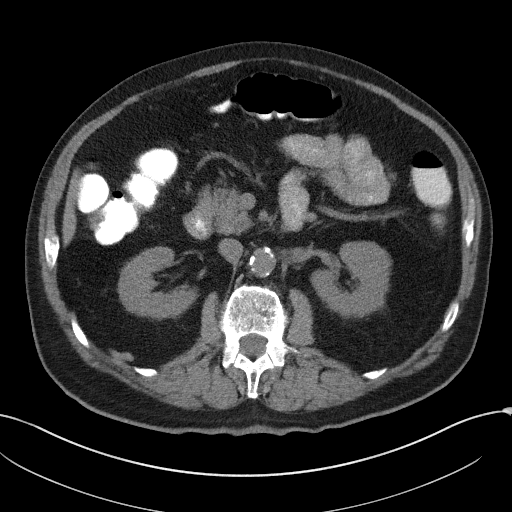
[im 69/88  soft-tissue]
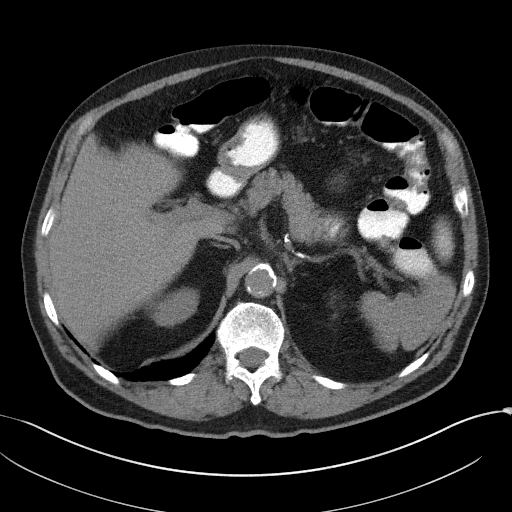
[im 77/88  soft-tissue]
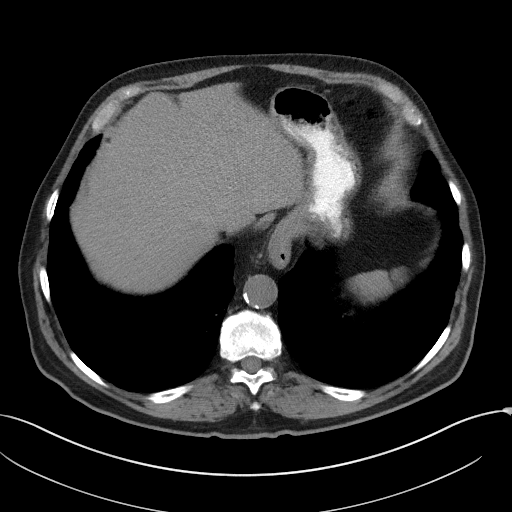
[im 84/88  soft-tissue]
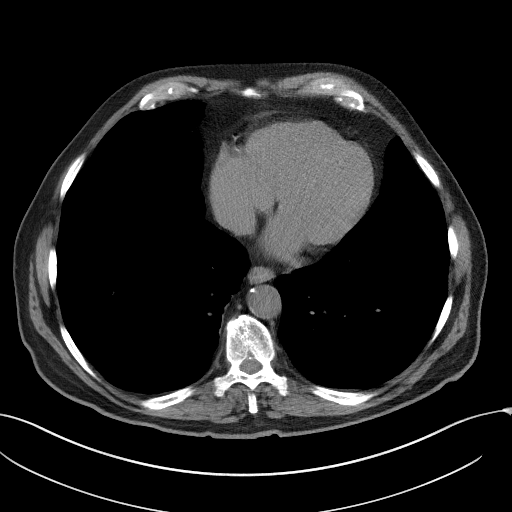

[Series 5: coronal st · coronal · 0.78mm/px · 3 of 109 slices shown]
[im 37/109  soft-tissue]
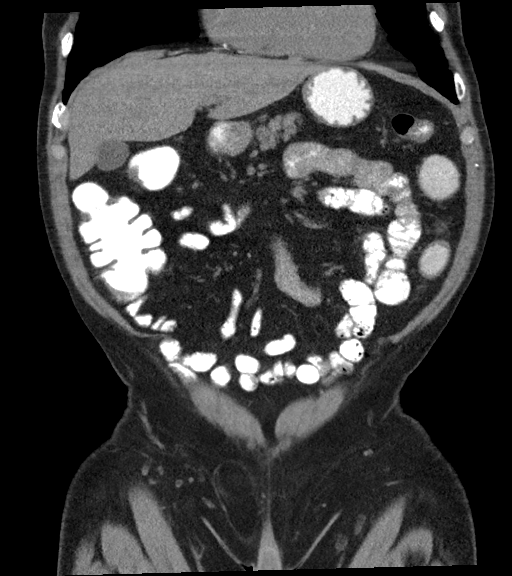
[im 49/109  soft-tissue]
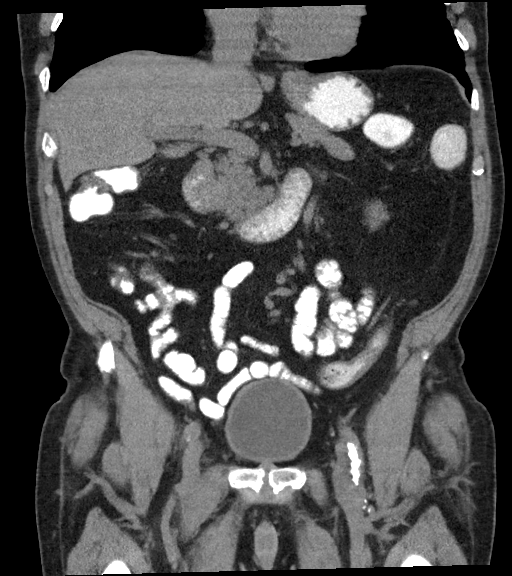
[im 61/109  soft-tissue]
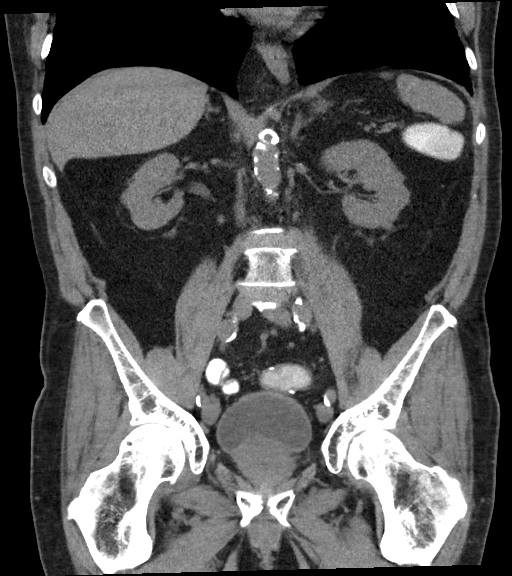

[16 of 46 positions shown; findings below may reference images not displayed]

FINDINGS: Lower chest: No acute abnormality. Mild subpleural fibrotic change
in the included lung bases. Coronary artery calcifications.

Hepatobiliary: No solid liver abnormality is seen. No gallstones,
gallbladder wall thickening, or biliary dilatation.

Pancreas: Unremarkable. No pancreatic ductal dilatation or
surrounding inflammatory changes.

Spleen: Normal in size without significant abnormality.

Adrenals/Urinary Tract: Adrenal glands are unremarkable. Kidneys are
normal, without renal calculi, solid lesion, or hydronephrosis.
Bladder is unremarkable.

Stomach/Bowel: Stomach is within normal limits. Appendix appears
normal. No evidence of bowel wall thickening, distention, or
inflammatory changes. Occasional descending colonic diverticulosis.
Evidence of prior sigmoid resection and anastomosis.

Vascular/Lymphatic: Aortic atherosclerosis. No enlarged abdominal or
pelvic lymph nodes.

Reproductive: Prostatomegaly.

Other: Fat containing right greater than left inguinal hernias. No
abdominopelvic ascites.

Musculoskeletal: No acute or significant osseous findings.
IMPRESSION: 1. Occasional descending colonic diverticulosis. Evidence of prior
sigmoid resection and anastomosis. No acute inflammatory findings to
suggest diverticulitis.

2.  Prostatomegaly.

3.  Fat containing right greater than left inguinal hernias.

4.  Other chronic and incidental findings as detailed above.

## 2021-03-27 ENCOUNTER — Other Ambulatory Visit: Payer: Self-pay

## 2021-03-27 DIAGNOSIS — L409 Psoriasis, unspecified: Secondary | ICD-10-CM

## 2021-03-27 MED ORDER — COSENTYX SENSOREADY (300 MG) 150 MG/ML ~~LOC~~ SOAJ: 300.0000 mg | SUBCUTANEOUS | 2 refills | Status: DC

## 2021-03-27 NOTE — Progress Notes (Signed)
Cosentyx RFs for patient assistance.

## 2021-03-27 NOTE — Progress Notes (Signed)
Originally sent to wrong pharmacy.

## 2021-03-31 ENCOUNTER — Telehealth: Payer: Self-pay

## 2021-03-31 NOTE — Telephone Encounter (Signed)
We received a fax from [facility] on patient. He is requesting a covered and cheaper alternative for his Calcipotriene-Betamethasone Ointment. [employer] covers Triamcinolone Ointment or Mometasone Ointment at a cheaper tier. Would you like to change his medication?  [place], [place]

## 2021-04-08 MED ORDER — TRIAMCINOLONE ACETONIDE 0.1 % EX OINT: 1.0000 "application " | TOPICAL_OINTMENT | Freq: Every day | CUTANEOUS | 0 refills | Status: DC | PRN

## 2021-04-08 NOTE — Telephone Encounter (Signed)
Patient advised and RX sent in. aw 

## 2021-06-16 ENCOUNTER — Other Ambulatory Visit: Payer: Self-pay

## 2021-06-16 MED ORDER — TRIAMCINOLONE ACETONIDE 0.1 % EX OINT: 1.0000 "application " | TOPICAL_OINTMENT | Freq: Every day | CUTANEOUS | 0 refills | Status: AC | PRN

## 2021-06-16 NOTE — Progress Notes (Signed)
[place] RF request. aw ?

## 2021-06-17 ENCOUNTER — Ambulatory Visit: Payer: Medicare HMO | Admitting: Dermatology

## 2021-08-05 ENCOUNTER — Ambulatory Visit: Payer: Medicare HMO | Admitting: Dermatology

## 2021-08-05 DIAGNOSIS — L570 Actinic keratosis: Secondary | ICD-10-CM

## 2021-08-05 DIAGNOSIS — L409 Psoriasis, unspecified: Secondary | ICD-10-CM

## 2021-08-05 DIAGNOSIS — Z79899 Other long term (current) drug therapy: Secondary | ICD-10-CM

## 2021-08-05 MED ORDER — TACROLIMUS 0.1 % EX OINT: TOPICAL_OINTMENT | Freq: Two times a day (BID) | CUTANEOUS | 2 refills | Status: DC

## 2021-08-05 NOTE — Progress Notes (Signed)
? ?Follow-Up Visit ?  ?Subjective  ?Lawrence Bush is a 88 y.o. male who presents for the following: Psoriasis (Patient here today for 6 month psoriasis follow up. Patient is on Cosentyx and using TMC 0.1% ointment daily to lower legs. ). ? ?Patient feels he is well controlled on current treatment. No side effects, no infections.  Has spot on nose to check, has improved.  No bleeding. ? ?The following portions of the chart were reviewed this encounter and updated as appropriate:  ?  ?  ? ?Review of Systems:  No other skin or systemic complaints except as noted in HPI or Assessment and Plan. ? ?Objective  ?Well appearing patient in no apparent distress; mood and affect are within normal limits. ? ?A focused examination was performed including back, face, lower legs. Relevant physical exam findings are noted in the Assessment and Plan. ? ?lower legs ?Small pink scaly papules on bilateral lower legs ? ?Prominent vasculature with skin thinning lower legs ? ?Nose ?6.0 mm pink white depressed macule, mild scale ? ? ? ? ? ? ?Assessment & Plan  ?Psoriasis ?lower legs ? ?Chronic and persistent condition with duration or expected duration over one year. Condition is improved but not currently at goal.  ? ?Psoriasis - severe on systemic ?biologic? treatment injections.  Psoriasis is a chronic non-curable, but treatable genetic/hereditary disease that may have other systemic features affecting other organ systems such as joints (Psoriatic Arthritis).  It is linked with heart disease, inflammatory bowel disease, non-alcoholic fatty liver disease, and depression. Significant skin psoriasis and/or psoriatic arthritis may have significant symptoms and affects activities of daily activity and often benefits from systemic ?biologic? injection treatments.  These ?biologic? treatments have some potential side effects including immunosuppression and require pre-treatment laboratory screening and periodic laboratory monitoring and  periodic in person evaluation and monitoring by the attending dermatologist physician (long term medication management).   ? ?Continue Cosentyx 300 mg SQ Q28 days. Patient doing injections at home.  Patient given lab order for QuantiFERON-TB, CBC and CMP and will have done with PCP. ?Trade size of Zoryve cream given to patient to use once daily to legs for psoriasis.  ?Lot # [id number]   Exp: Nov 2023 OR ?Start Tacrolimus 0.1% ointment 1-2 times daily to lower legs as needed. ?D/C TMC ointment due to resulting skin atrophy. May restart TMC 0.1% ointment 1-2 times daily as needed for flares for up to two weeks then D/C. Avoid applying to face, groin, and axilla. Use as directed. Long-term use can cause thinning of the skin. ? ?Topical steroids (such as triamcinolone, fluocinolone, fluocinonide, mometasone, clobetasol, halobetasol, betamethasone, hydrocortisone) can cause thinning and lightening of the skin if they are used for too long in the same area. Your physician has selected the right strength medicine for your problem and area affected on the body. Please use your medication only as directed by your physician to prevent side effects.  ? ?Reviewed risks of biologics including immunosuppression, infections, injection site reaction, and failure to improve condition. Goal is control of skin condition, not cure.  Some older biologics such as Humira and Enbrel may slightly increase risk of malignancy and may worsen congestive heart failure. The use of biologics requires long term medication management, including periodic office visits and monitoring of blood work. ? ? ? ? ?tacrolimus (PROTOPIC) 0.1 % ointment - lower legs ?Apply topically 2 (two) times daily. Apply to affected areas of psoriasis 1-2 times daily as needed. ? ?Related Procedures ?Comprehensive metabolic panel ?  CBC with Differential/Platelet ?QuantiFERON-TB Gold Plus ? ?Related Medications ?Secukinumab, 300 MG Dose, (COSENTYX SENSOREADY, 300 MG,) 150  MG/ML SOAJ ?Inject 300 mg into the skin every 28 (twenty-eight) days. For maintenance. ? ?AK (actinic keratosis) ?Nose ? ?Recheck on f/up ? ?Biopsy if not clear ? ?Destruction of lesion - Nose ? ?Destruction method: cryotherapy   ?Informed consent: discussed and consent obtained   ?Lesion destroyed using liquid nitrogen: Yes   ?Region frozen until ice ball extended beyond lesion: Yes   ?Outcome: patient tolerated procedure well with no complications   ?Post-procedure details: wound care instructions given   ?Additional details:  Prior to procedure, discussed risks of blister formation, small wound, skin dyspigmentation, or rare scar following cryotherapy. Recommend Vaseline ointment to treated areas while healing.  ? ? ?Return in about 6 months (around 02/04/2022) for Psoriasis, recheck AK at nose. ? ?Graciella Belton, RMA, am acting as scribe for Brendolyn Patty, MD . ? ?Documentation: I have reviewed the above documentation for accuracy and completeness, and I agree with the above. ? ?Brendolyn Patty MD  ? ?

## 2021-08-05 NOTE — Patient Instructions (Addendum)
Psoriasis is a chronic non-curable, but treatable genetic/hereditary disease that may have other systemic features affecting other organ systems such as joints (Psoriatic Arthritis). It is associated with an increased risk of inflammatory bowel disease, heart disease, non-alcoholic fatty liver disease, and depression.   ? ?Continue Cosentyx 300 mg every 28 days.  ?Start tacrolimus 1-2 times daily to lower legs as needed. ?May continue triamcinolone 0.1% ointment 1-2 times daily as needed for flares. Avoid applying to face, groin, and axilla. Use as directed. Long-term use can cause thinning of the skin. ? ?Topical steroids (such as triamcinolone, fluocinolone, fluocinonide, mometasone, clobetasol, halobetasol, betamethasone, hydrocortisone) can cause thinning and lightening of the skin if they are used for too long in the same area. Your physician has selected the right strength medicine for your problem and area affected on the body. Please use your medication only as directed by your physician to prevent side effects.  ? ?Reviewed risks of biologics including immunosuppression, infections, injection site reaction, and failure to improve condition. Goal is control of skin condition, not cure.  Some older biologics such as Humira and Enbrel may slightly increase risk of malignancy and may worsen congestive heart failure. The use of biologics requires long term medication management, including periodic office visits and monitoring of blood work. ? ?Cryotherapy Aftercare ? ?Wash gently with soap and water everyday.   ?Apply Vaseline and Band-Aid daily until healed.  ? ? ? ?If You Need Anything After Your Visit ? ?If you have any questions or concerns for your doctor, please call our main line at [phone] and press option 4 to reach your doctor's medical assistant. If no one answers, please leave a voicemail as directed and we will return your call as soon as possible. Messages left after 4 pm will be answered the  following business day.  ? ?You may also send [place] a message via MyChart. We typically respond to MyChart messages within 1-2 business days. ? ?For prescription refills, please ask your pharmacy to contact our office. Our fax number is [phone]. ? ?If you have an urgent issue when the clinic is closed that cannot wait until the next business day, you can page your doctor at the number below.   ? ?Please note that while we do our best to be available for urgent issues outside of office hours, we are not available 24/7.  ? ?If you have an urgent issue and are unable to reach [place], you may choose to seek medical care at your doctor's office, retail clinic, urgent care center, or emergency room. ? ?If you have a medical emergency, please immediately call 911 or go to the emergency department. ? ?Pager Numbers ? ?- Dr. Nehemiah Massed: [phone] ? ?- Dr. Laurence Ferrari: [phone] ? ?- Dr. Nicole Kindred: [phone] ? ?In the event of inclement weather, please call our main line at [phone] for an update on the status of any delays or closures. ? ?Dermatology Medication Tips: ?Please keep the boxes that topical medications come in in order to help keep track of the instructions about where and how to use these. Pharmacies typically print the medication instructions only on the boxes and not directly on the medication tubes.  ? ?If your medication is too expensive, please contact our office at [phone] option 4 or send [place] a message through [facility].  ? ?We are unable to tell what your co-pay for medications will be in advance as this is different depending on your insurance coverage. However, we may be able to find  a substitute medication at lower cost or fill out paperwork to get insurance to cover a needed medication.  ? ?If a prior authorization is required to get your medication covered by your insurance company, please allow [place] 1-2 business days to complete this process. ? ?Drug prices often vary depending on where the  prescription is filled and some pharmacies may offer cheaper prices. ? ?The website www.goodrx.com contains coupons for medications through different pharmacies. The prices here do not account for what the cost may be with help from insurance (it may be cheaper with your insurance), but the website can give you the price if you did not use any insurance.  ?- You can print the associated coupon and take it with your prescription to the pharmacy.  ?- You may also stop by our office during regular business hours and pick up a GoodRx coupon card.  ?- If you need your prescription sent electronically to a different pharmacy, notify our office through [facility] or by phone at [phone] option 4. ? ? ? ? ?Si Usted Necesita Algo Despu?s de Su Visita ? ?Tambi?n puede enviarnos un mensaje a trav?s de MyChart. Por lo general respondemos a los mensajes de MyChart en el transcurso de 1 a 2 d?as h?biles. ? ?Para renovar recetas, por favor pida a su farmacia que se ponga en contacto con nuestra oficina. Nuestro n?mero de fax es el [phone]. ? ?Si tiene un asunto urgente cuando la cl?nica est? cerrada y que no puede esperar hasta el siguiente d?a h?bil, puede llamar/localizar a su doctor(a) al n?mero que aparece a continuaci?n.  ? ?Por favor, tenga en cuenta que aunque hacemos todo lo posible para estar disponibles para asuntos urgentes fuera del horario de oficina, no estamos disponibles las 24 horas del d?a, los 7 d?as de la semana.  ? ?Si tiene un problema urgente y no puede comunicarse con nosotros, puede optar por buscar atenci?n m?dica  en el consultorio de su doctor(a), en una cl?nica privada, en un centro de atenci?n urgente o en una sala de emergencias. ? ?Si tiene Engineer, maintenance (IT) m?dica, por favor llame inmediatamente al 911 o vaya a la sala de emergencias. ? ?N?meros de b?per ? ?- Dr. Nehemiah Massed: [phone] ? ?- Dra. Moye: [phone] ? ?- Dra. Nicole Kindred: [phone] ? ?En caso de inclemencias del tiempo,  por favor llame a nuestra l?nea principal al [phone] para una actualizaci?n sobre el estado de cualquier retraso o cierre. ? ?Consejos para la medicaci?n en dermatolog?a: ?Por favor, guarde las cajas en las que vienen los medicamentos de uso t?pico para ayudarle a seguir las instrucciones sobre d?nde y c?mo usarlos. Las farmacias generalmente imprimen las instrucciones del medicamento s?lo en las cajas y no directamente en los tubos del [place].  ? ?Si su medicamento es muy caro, por favor, p?ngase en contacto con Zigmund Daniel llamando al [phone] y presione la opci?n 4 o env?enos un mensaje a trav?s de MyChart.  ? ?No podemos decirle cu?l ser? su copago por los medicamentos por adelantado ya que esto es diferente dependiendo de la cobertura de su seguro. Sin embargo, es posible que podamos encontrar un medicamento sustituto a Electrical engineer un formulario para que el seguro cubra el medicamento que se considera necesario.  ? ?Si se requiere Ardelia Mems autorizaci?n previa para que su compa??a de seguros [place] su medicamento, por favor perm?tanos de 1 a 2 d?as h?biles para completar este proceso. ? ?Los precios de los medicamentos var?an con frecuencia dependiendo del  lugar de d?nde se surte la receta y alguna farmacias pueden ofrecer precios m?s baratos. ? ?El sitio web www.goodrx.com tiene cupones para medicamentos de Airline pilot. Los precios aqu? no tienen en cuenta lo que podr?a costar con la ayuda del seguro (puede ser m?s barato con su seguro), pero el sitio web puede darle el precio si no utiliz? ning?n seguro.  ?- Puede imprimir el cup?n correspondiente y llevarlo con su receta a la farmacia.  ?- Tambi?n puede pasar por nuestra oficina durante el horario de atenci?n regular y recoger una tarjeta de cupones de GoodRx.  ?- Si necesita que su receta se env?e electr?nicamente a Chiropodist, informe a nuestra oficina a trav?s de MyChart de [place] o por tel?fono llamando al  [phone] y presione la opci?n 4. ? ?

## 2021-10-08 ENCOUNTER — Telehealth: Payer: Self-pay

## 2021-10-08 ENCOUNTER — Other Ambulatory Visit: Payer: Self-pay

## 2021-10-08 MED ORDER — ZORYVE 0.3 % EX CREA: 1.0000 | TOPICAL_CREAM | Freq: Every day | CUTANEOUS | 1 refills | Status: AC

## 2021-10-08 NOTE — Telephone Encounter (Addendum)
Patient sent copy of updated lab work in mail. Dr. Nicole Kindred reviewed and OK except no updated TB lab test. Last shown Quantiferon TB Gold 03/2020.  Left message for patient to return my call.  I can send copy of TB requisition in mail and closest [facility] I could find was [facility] or [place] or patient is more then welcome to go to closest MD office if he can get in. aw

## 2021-10-08 NOTE — Progress Notes (Signed)
Patient was provided trade size sample of Zoryve at last appointment.   Running low and needing RF. Advised patient this may not be covered or very expensive but to call me back if unaffordable and we will think of something else to do.

## 2021-10-08 NOTE — Telephone Encounter (Signed)
Patient advised of information. aw 

## 2021-10-20 ENCOUNTER — Other Ambulatory Visit: Payer: Self-pay | Admitting: Dermatology

## 2021-10-23 LAB — QUANTIFERON-TB GOLD PLUS
QuantiFERON Mitogen Value: 1.48 IU/mL
QuantiFERON Nil Value: 0.03 IU/mL
QuantiFERON TB1 Ag Value: 0.02 IU/mL
QuantiFERON TB2 Ag Value: 0.03 IU/mL
QuantiFERON-TB Gold Plus: NEGATIVE

## 2021-11-03 ENCOUNTER — Telehealth: Payer: Self-pay

## 2021-11-03 ENCOUNTER — Other Ambulatory Visit: Payer: Self-pay

## 2021-11-03 DIAGNOSIS — L409 Psoriasis, unspecified: Secondary | ICD-10-CM

## 2021-11-03 MED ORDER — COSENTYX SENSOREADY (300 MG) 150 MG/ML ~~LOC~~ SOAJ: 300.0000 mg | SUBCUTANEOUS | 2 refills | Status: AC

## 2021-11-03 NOTE — Telephone Encounter (Signed)
-----   Message from Brendolyn Patty, MD sent at 10/30/2021  7:47 PM EDT ----- Negative TB, continue Cosentyx, send in 6 mos rfs  - please call patient

## 2021-11-03 NOTE — Telephone Encounter (Signed)
Advised patient labs were negative and Cosentyx refills sent in.

## 2022-02-24 ENCOUNTER — Ambulatory Visit: Payer: Medicare HMO | Admitting: Dermatology

## 2022-03-31 ENCOUNTER — Ambulatory Visit: Payer: Medicare HMO | Admitting: Dermatology

## 2022-03-31 DIAGNOSIS — L409 Psoriasis, unspecified: Secondary | ICD-10-CM | POA: Diagnosis not present

## 2022-03-31 DIAGNOSIS — Z79899 Other long term (current) drug therapy: Secondary | ICD-10-CM | POA: Diagnosis not present

## 2022-03-31 DIAGNOSIS — C44311 Basal cell carcinoma of skin of nose: Secondary | ICD-10-CM | POA: Diagnosis not present

## 2022-03-31 DIAGNOSIS — D485 Neoplasm of uncertain behavior of skin: Secondary | ICD-10-CM

## 2022-03-31 DIAGNOSIS — L570 Actinic keratosis: Secondary | ICD-10-CM

## 2022-03-31 DIAGNOSIS — C4491 Basal cell carcinoma of skin, unspecified: Secondary | ICD-10-CM

## 2022-03-31 DIAGNOSIS — B079 Viral wart, unspecified: Secondary | ICD-10-CM | POA: Diagnosis not present

## 2022-03-31 HISTORY — DX: Basal cell carcinoma of skin, unspecified: C44.91

## 2022-03-31 NOTE — Patient Instructions (Addendum)
Cryotherapy Aftercare  Wash gently with soap and water everyday.   Apply Vaseline and Band-Aid daily until healed.  Wound Care Instructions  Cleanse wound gently with soap and water once a day then pat dry with clean gauze. Apply a thin coat of Petrolatum (petroleum jelly, "Vaseline") over the wound (unless you have an allergy to this). We recommend that you use a new, sterile tube of Vaseline. Do not pick or remove scabs. Do not remove the yellow or white "healing tissue" from the base of the wound.  Cover the wound with fresh, clean, nonstick gauze and secure with paper tape. You may use Band-Aids in place of gauze and tape if the wound is small enough, but would recommend trimming much of the tape off as there is often too much. Sometimes Band-Aids can irritate the skin.  You should call the office for your biopsy report after 1 week if you have not already been contacted.  If you experience any problems, such as abnormal amounts of bleeding, swelling, significant bruising, significant pain, or evidence of infection, please call the office immediately.  FOR ADULT SURGERY PATIENTS: If you need something for pain relief you may take 1 extra strength Tylenol (acetaminophen) AND 2 Ibuprofen (200mg each) together every 4 hours as needed for pain. (do not take these if you are allergic to them or if you have a reason you should not take them.) Typically, you may only need pain medication for 1 to 3 days.      Due to recent changes in healthcare laws, you may see results of your pathology and/or laboratory studies on MyChart before the doctors have had a chance to review them. We understand that in some cases there may be results that are confusing or concerning to you. Please understand that not all results are received at the same time and often the doctors may need to interpret multiple results in order to provide you with the best plan of care or course of treatment. Therefore, we ask that you  please give us 2 business days to thoroughly review all your results before contacting the office for clarification. Should we see a critical lab result, you will be contacted sooner.   If You Need Anything After Your Visit  If you have any questions or concerns for your doctor, please call our main line at [phone] and press option 4 to reach your doctor's medical assistant. If no one answers, please leave a voicemail as directed and we will return your call as soon as possible. Messages left after 4 pm will be answered the following business day.   You may also send us a message via MyChart. We typically respond to MyChart messages within 1-2 business days.  For prescription refills, please ask your pharmacy to contact our office. Our fax number is [phone].  If you have an urgent issue when the clinic is closed that cannot wait until the next business day, you can page your doctor at the number below.    Please note that while we do our best to be available for urgent issues outside of office hours, we are not available 24/7.   If you have an urgent issue and are unable to reach us, you may choose to seek medical care at your doctor's office, retail clinic, urgent care center, or emergency room.  If you have a medical emergency, please immediately call 911 or go to the emergency department.  Pager Numbers  - Dr. Kowalski: [phone]  -   Dr. Moye: [phone]  - Dr. Stewart: [phone]  In the event of inclement weather, please call our main line at [phone] for an update on the status of any delays or closures.  Dermatology Medication Tips: Please keep the boxes that topical medications come in in order to help keep track of the instructions about where and how to use these. Pharmacies typically print the medication instructions only on the boxes and not directly on the medication tubes.   If your medication is too expensive, please contact our office at  [phone] option 4 or send us a message through MyChart.   We are unable to tell what your co-pay for medications will be in advance as this is different depending on your insurance coverage. However, we may be able to find a substitute medication at lower cost or fill out paperwork to get insurance to cover a needed medication.   If a prior authorization is required to get your medication covered by your insurance company, please allow us 1-2 business days to complete this process.  Drug prices often vary depending on where the prescription is filled and some pharmacies may offer cheaper prices.  The website www.goodrx.com contains coupons for medications through different pharmacies. The prices here do not account for what the cost may be with help from insurance (it may be cheaper with your insurance), but the website can give you the price if you did not use any insurance.  - You can print the associated coupon and take it with your prescription to the pharmacy.  - You may also stop by our office during regular business hours and pick up a GoodRx coupon card.  - If you need your prescription sent electronically to a different pharmacy, notify our office through [place] MyChart or by phone at [phone] option 4.     Si Usted Necesita Algo Despus de Su Visita  Tambin puede enviarnos un mensaje a travs de MyChart. Por lo general respondemos a los mensajes de MyChart en el transcurso de 1 a 2 das hbiles.  Para renovar recetas, por favor pida a su farmacia que se ponga en contacto con nuestra oficina. Nuestro nmero de fax es el [phone].  Si tiene un asunto urgente cuando la clnica est cerrada y que no puede esperar hasta el siguiente da hbil, puede llamar/localizar a su doctor(a) al nmero que aparece a continuacin.   Por favor, tenga en cuenta que aunque hacemos todo lo posible para estar disponibles para asuntos urgentes fuera del horario de oficina, no estamos  disponibles las 24 horas del da, los 7 das de la semana.   Si tiene un problema urgente y no puede comunicarse con nosotros, puede optar por buscar atencin mdica  en el consultorio de su doctor(a), en una clnica privada, en un centro de atencin urgente o en una sala de emergencias.  Si tiene una emergencia mdica, por favor llame inmediatamente al 911 o vaya a la sala de emergencias.  Nmeros de bper  - Dr. Kowalski: [phone]  - Dra. Moye: [phone]  - Dra. Stewart: [phone]  En caso de inclemencias del tiempo, por favor llame a nuestra lnea principal al [phone] para una actualizacin sobre el estado de cualquier retraso o cierre.  Consejos para la medicacin en dermatologa: Por favor, guarde las cajas en las que vienen los medicamentos de uso tpico para ayudarle a seguir las instrucciones sobre dnde y cmo usarlos. Las farmacias generalmente imprimen las instrucciones del medicamento slo en las cajas y   no directamente en los tubos del medicamento.   Si su medicamento es muy caro, por favor, pngase en contacto con nuestra oficina llamando al [phone] y presione la opcin 4 o envenos un mensaje a travs de MyChart.   No podemos decirle cul ser su copago por los medicamentos por adelantado ya que esto es diferente dependiendo de la cobertura de su seguro. Sin embargo, es posible que podamos encontrar un medicamento sustituto a menor costo o llenar un formulario para que el seguro cubra el medicamento que se considera necesario.   Si se requiere una autorizacin previa para que su compaa de seguros cubra su medicamento, por favor permtanos de 1 a 2 das hbiles para completar este proceso.  Los precios de los medicamentos varan con frecuencia dependiendo del lugar de dnde se surte la receta y alguna farmacias pueden ofrecer precios ms baratos.  El sitio web www.goodrx.com tiene cupones para medicamentos de diferentes farmacias. Los precios aqu no  tienen en cuenta lo que podra costar con la ayuda del seguro (puede ser ms barato con su seguro), pero el sitio web puede darle el precio si no utiliz ningn seguro.  - Puede imprimir el cupn correspondiente y llevarlo con su receta a la farmacia.  - Tambin puede pasar por nuestra oficina durante el horario de atencin regular y recoger una tarjeta de cupones de GoodRx.  - Si necesita que su receta se enve electrnicamente a una farmacia diferente, informe a nuestra oficina a travs de MyChart de [place] o por telfono llamando al [phone] y presione la opcin 4.  

## 2022-03-31 NOTE — Progress Notes (Signed)
Follow-Up Visit   Subjective  Lawrence Bush is a 88 y.o. male who presents for the following: Psoriasis (Patient here today for 6 month follow up. ) and Actinic Keratosis (Recheck AK at nose treated with LN2 at last visit. ).  No side effects, no infections from Cosentyx.  He also has a persistent wart on his index finger that he would like removed.  OTC sal scid hasn't helped.  Patient accompanied by wife.  The following portions of the chart were reviewed this encounter and updated as appropriate:       Review of Systems:  No other skin or systemic complaints except as noted in HPI or Assessment and Plan.  Objective  Well appearing patient in no apparent distress; mood and affect are within normal limits.  A focused examination was performed including legs, arms. Relevant physical exam findings are noted in the Assessment and Plan.  Left Lower Legs, elbows Pink scaly papule at left lower leg and ankle, small pink scaly plaque at right ankle, papules at medial ankle, elbows clear  nasal dorsum x 2 (2) Erythematous thin papules/macules with gritty scale.   right index palmar Verrucous papules -- Discussed viral etiology and contagion. 74m  central nasal tip Depressed pink white macule  5 mm (see photo 08/05/21)       Assessment & Plan  Psoriasis Left Lower Legs, elbows  Chronic condition with duration or expected duration over one year. Currently well-controlled on Cosentyx.   Psoriasis - severe on systemic "biologic" treatment injections.  Psoriasis is a chronic non-curable, but treatable genetic/hereditary disease that may have other systemic features affecting other organ systems such as joints (Psoriatic Arthritis).  It is linked with heart disease, inflammatory bowel disease, non-alcoholic fatty liver disease, and depression. Significant skin psoriasis and/or psoriatic arthritis may have significant symptoms and affects activities of daily activity and often benefits  from systemic "biologic" injection treatments.  These "biologic" treatments have some potential side effects including immunosuppression and require pre-treatment laboratory screening and periodic laboratory monitoring and periodic in person evaluation and monitoring by the attending dermatologist physician (long term medication management).   Continue Cosentyx '300mg'$  every 28 days. Continue tacrolimus 0.1% ointment 1-2 times daily as needed.   Reviewed risks of biologics including immunosuppression, infections, injection site reaction, and failure to improve condition. Goal is control of skin condition, not cure.  Some older biologics such as Humira and Enbrel may slightly increase risk of malignancy and may worsen congestive heart failure.  Talz and Cosentyx may cause inflammatory bowel disease to flare. The use of biologics requires long term medication management, including periodic office visits and monitoring of blood work.   Related Medications tacrolimus (PROTOPIC) 0.1 % ointment Apply topically 2 (two) times daily. Apply to affected areas of psoriasis 1-2 times daily as needed.  Secukinumab, 300 MG Dose, (COSENTYX SENSOREADY, 300 MG,) 150 MG/ML SOAJ Inject 300 mg into the skin every 28 (twenty-eight) days. For maintenance.  AK (actinic keratosis) (2) nasal dorsum x 2  Actinic keratoses are precancerous spots that appear secondary to cumulative UV radiation exposure/sun exposure over time. They are chronic with expected duration over 1 year. A portion of actinic keratoses will progress to squamous cell carcinoma of the skin. It is not possible to reliably predict which spots will progress to skin cancer and so treatment is recommended to prevent development of skin cancer.  Recommend daily broad spectrum sunscreen SPF 30+ to sun-exposed areas, reapply every 2 hours as needed.  Recommend staying  in the shade or wearing long sleeves, sun glasses (UVA+UVB protection) and wide brim hats  (4-inch brim around the entire circumference of the hat). Call for new or changing lesions.  Recheck nasal tip at follow up.   Destruction of lesion - nasal dorsum x 2  Destruction method: cryotherapy   Informed consent: discussed and consent obtained   Lesion destroyed using liquid nitrogen: Yes   Region frozen until ice ball extended beyond lesion: Yes   Outcome: patient tolerated procedure well with no complications   Post-procedure details: wound care instructions given   Additional details:  Prior to procedure, discussed risks of blister formation, small wound, skin dyspigmentation, or rare scar following cryotherapy. Recommend Vaseline ointment to treated areas while healing.   Viral warts, unspecified type right index palmar  Viral Wart (HPV) Counseling  Discussed viral / HPV (Human Papilloma Virus) etiology and risk of spread /infectivity to other areas of body as well as to other people.  Multiple treatments and methods may be required to clear warts and it is possible treatment may not be successful.  Treatment risks include discoloration; scarring and there is still potential for wart recurrence.  Destruction of lesion - right index palmar  Destruction method: cryotherapy   Informed consent: discussed and consent obtained   Lesion destroyed using liquid nitrogen: Yes   Region frozen until ice ball extended beyond lesion: Yes   Outcome: patient tolerated procedure well with no complications   Post-procedure details: wound care instructions given   Additional details:  Prior to procedure, discussed risks of blister formation, small wound, skin dyspigmentation, or rare scar following cryotherapy. Recommend Vaseline ointment to treated areas while healing.   Neoplasm of uncertain behavior of skin central nasal tip  Skin / nail biopsy Type of biopsy: punch   Informed consent: discussed and consent obtained   Patient was prepped and draped in usual sterile fashion: Area  prepped with alcohol. Anesthesia: the lesion was anesthetized in a standard fashion   Anesthetic:  1% lidocaine w/ epinephrine 1-100,000 buffered w/ 8.4% NaHCO3 Punch biopsy size: 1.5 mm. Suture size:  5-0 Suture type: fast-absorbing plain gut   Suture removal (days):  0 Hemostasis achieved with: suture and pressure   Outcome: patient tolerated procedure well   Post-procedure details: wound care instructions given   Post-procedure details comment:  Ointment and small bandage applied Additional details:  Pink scaly macule biopsied at 11 pm  Specimen 1 - Surgical pathology Differential Diagnosis: Scar r/o BCC  Check Margins: No Depressed pink white macule 5 mm (see photo 08/05/21)   Return in about 6 months (around 09/30/2022) for psoriasis.  Graciella Belton, RMA, am acting as scribe for Brendolyn Patty, MD .  Documentation: I have reviewed the above documentation for accuracy and completeness, and I agree with the above.  Brendolyn Patty MD

## 2022-04-14 ENCOUNTER — Telehealth: Payer: Self-pay

## 2022-04-14 NOTE — Telephone Encounter (Signed)
-----   Message from Brendolyn Patty, MD sent at 04/14/2022  8:31 AM EST ----- Skin , central nasal trip BASAL CELL CARCINOMA, NODULAR PATTERN  BCC skin cancer, [place] Mohs [place] where he prefers to have it done. - please call patient

## 2022-04-14 NOTE — Telephone Encounter (Signed)
Left pt msg to call for bx result/sh °

## 2022-04-15 ENCOUNTER — Telehealth: Payer: Self-pay

## 2022-04-15 NOTE — Telephone Encounter (Signed)
-----   Message from Brendolyn Patty, MD sent at 04/14/2022  8:31 AM EST ----- Skin , central nasal trip BASAL CELL CARCINOMA, NODULAR PATTERN  BCC skin cancer, [place] Mohs [place] where he prefers to have it done. - please call patient

## 2022-04-15 NOTE — Telephone Encounter (Signed)
Left msg for pt to return call. aw

## 2022-04-16 ENCOUNTER — Telehealth: Payer: Self-pay

## 2022-04-16 DIAGNOSIS — C44311 Basal cell carcinoma of skin of nose: Secondary | ICD-10-CM

## 2022-04-16 NOTE — Telephone Encounter (Signed)
Patient advised of BX results. He is going to [facility] towards [place] [facility] and let me know who and where he decides to go and I will send referral. aw

## 2022-04-16 NOTE — Telephone Encounter (Signed)
-----   Message from Brendolyn Patty, MD sent at 04/14/2022  8:31 AM EST ----- Skin , central nasal trip BASAL CELL CARCINOMA, NODULAR PATTERN  BCC skin cancer, [place] Mohs [place] where he prefers to have it done. - please call patient

## 2022-04-27 NOTE — Telephone Encounter (Signed)
Patient called back and referral sent to [facility] for [facility]. aw

## 2022-04-27 NOTE — Addendum Note (Signed)
Addended by: Johnsie Kindred R on: 04/27/2022 02:56 PM   Modules accepted: Orders

## 2022-09-22 ENCOUNTER — Ambulatory Visit: Payer: Medicare HMO | Admitting: Dermatology

## 2023-03-24 ENCOUNTER — Ambulatory Visit: Payer: Medicare HMO | Admitting: Dermatology

## 2023-03-24 ENCOUNTER — Encounter: Payer: Self-pay | Admitting: Dermatology

## 2023-03-24 DIAGNOSIS — Z5181 Encounter for therapeutic drug level monitoring: Secondary | ICD-10-CM | POA: Diagnosis not present

## 2023-03-24 DIAGNOSIS — W908XXA Exposure to other nonionizing radiation, initial encounter: Secondary | ICD-10-CM

## 2023-03-24 DIAGNOSIS — L409 Psoriasis, unspecified: Secondary | ICD-10-CM | POA: Diagnosis not present

## 2023-03-24 DIAGNOSIS — Z79899 Other long term (current) drug therapy: Secondary | ICD-10-CM

## 2023-03-24 DIAGNOSIS — Z85828 Personal history of other malignant neoplasm of skin: Secondary | ICD-10-CM

## 2023-03-24 DIAGNOSIS — L570 Actinic keratosis: Secondary | ICD-10-CM

## 2023-03-24 DIAGNOSIS — Z7189 Other specified counseling: Secondary | ICD-10-CM | POA: Diagnosis not present

## 2023-03-24 DIAGNOSIS — L820 Inflamed seborrheic keratosis: Secondary | ICD-10-CM | POA: Diagnosis not present

## 2023-03-24 MED ORDER — CLOBETASOL PROPIONATE 0.05 % EX CREA: TOPICAL_CREAM | CUTANEOUS | 2 refills | Status: AC

## 2023-03-24 MED ORDER — TACROLIMUS 0.1 % EX OINT: TOPICAL_OINTMENT | Freq: Two times a day (BID) | CUTANEOUS | 2 refills | Status: AC

## 2023-03-24 NOTE — Patient Instructions (Addendum)
Continue Cosentyx 300mg  every 28 days. Continue tacrolimus 0.1% ointment once daily when using with Clobetasol. Can use once or twice daily when not using Clobetasol. Start Clobetasol cream once daily for 2 weeks then discontinue. Use as needed for flares.     Reviewed risks of biologics including immunosuppression, infections, injection site reaction, and failure to improve condition. Goal is control of skin condition, not cure.  Some older biologics such as Humira and Enbrel may slightly increase risk of malignancy and may worsen congestive heart failure.  Taltz and Cosentyx may cause inflammatory bowel disease to flare. The use of biologics requires long term medication management, including periodic office visits and monitoring of blood work.    Cryotherapy Aftercare  Wash gently with soap and water everyday.   Apply Vaseline jelly daily until healed.    Due to recent changes in healthcare laws, you may see results of your pathology and/or laboratory studies on MyChart before the doctors have had a chance to review them. We understand that in some cases there may be results that are confusing or concerning to you. Please understand that not all results are received at the same time and often the doctors may need to interpret multiple results in order to provide you with the best plan of care or course of treatment. Therefore, we ask that you please give [place] 2 business days to thoroughly review all your results before contacting the office for clarification. Should we see a critical lab result, you will be contacted sooner.   If You Need Anything After Your Visit  If you have any questions or concerns for your doctor, please call our main line at [phone] and press option 4 to reach your doctor's medical assistant. If no one answers, please leave a voicemail as directed and we will return your call as soon as possible. Messages left after 4 pm will be answered the following business day.    You may also send [place] a message via MyChart. We typically respond to MyChart messages within 1-2 business days.  For prescription refills, please ask your pharmacy to contact our office. Our fax number is [phone].  If you have an urgent issue when the clinic is closed that cannot wait until the next business day, you can page your doctor at the number below.    Please note that while we do our best to be available for urgent issues outside of office hours, we are not available 24/7.   If you have an urgent issue and are unable to reach [place], you may choose to seek medical care at your doctor's office, retail clinic, urgent care center, or emergency room.  If you have a medical emergency, please immediately call 911 or go to the emergency department.  Pager Numbers  - Dr. Gwen Pounds: [phone]  - Dr. Roseanne Reno: [phone]  - Dr. Katrinka Blazing: [phone]   In the event of inclement weather, please call our main line at [phone] for an update on the status of any delays or closures.  Dermatology Medication Tips: Please keep the boxes that topical medications come in in order to help keep track of the instructions about where and how to use these. Pharmacies typically print the medication instructions only on the boxes and not directly on the medication tubes.   If your medication is too expensive, please contact our office at [phone] option 4 or send [place] a message through MyChart.   We are unable to tell what your co-pay for medications will be  in advance as this is different depending on your insurance coverage. However, we may be able to find a substitute medication at lower cost or fill out paperwork to get insurance to cover a needed medication.   If a prior authorization is required to get your medication covered by your insurance company, please allow [place] 1-2 business days to complete this process.  Drug prices often vary depending on where the prescription is filled and  some pharmacies may offer cheaper prices.  The website www.goodrx.com contains coupons for medications through different pharmacies. The prices here do not account for what the cost may be with help from insurance (it may be cheaper with your insurance), but the website can give you the price if you did not use any insurance.  - You can print the associated coupon and take it with your prescription to the pharmacy.  - You may also stop by our office during regular business hours and pick up a GoodRx coupon card.  - If you need your prescription sent electronically to a different pharmacy, notify our office through [facility] or by phone at [phone] option 4.     Si Usted Necesita Algo Despus de Su Visita  Tambin puede enviarnos un mensaje a travs de Clinical cytogeneticist. Por lo general respondemos a los mensajes de MyChart en el transcurso de 1 a 2 das hbiles.  Para renovar recetas, por favor pida a su farmacia que se ponga en contacto con nuestra oficina. Annie Sable de fax es [place] [phone].  Si tiene un asunto urgente cuando la clnica est cerrada y que no puede esperar hasta el siguiente da hbil, puede llamar/localizar a su doctor(a) al nmero que aparece a continuacin.   Por favor, tenga en cuenta que aunque hacemos todo lo posible para estar disponibles para asuntos urgentes fuera del horario de [place], no estamos disponibles las 24 horas del da, los 7 [address]  [address] de la [place].   Si tiene un problema urgente y no puede comunicarse con nosotros, puede optar por buscar atencin mdica  en el consultorio de su doctor(a), en una clnica privada, en un centro de atencin urgente o en una sala de emergencias.  Si tiene Engineer, drilling, por favor llame inmediatamente al 911 o vaya a la sala de emergencias.  Nmeros de bper  - Dr. Gwen Pounds: [phone]  - Dra. Roseanne Reno: [phone]  - Dr. Katrinka Blazing: [phone]   En caso de inclemencias del tiempo, por favor llame a Lacy Duverney principal al [phone] para una actualizacin sobre el [place] de cualquier retraso o cierre.  Consejos para la medicacin en dermatologa: Por favor, guarde las cajas en las que vienen los medicamentos de uso tpico para ayudarle a seguir las instrucciones sobre dnde y cmo usarlos. Las farmacias generalmente imprimen las instrucciones del medicamento slo en las cajas y no directamente en los tubos del [place].   Si su medicamento es muy caro, por favor, pngase en contacto con Rolm Gala llamando al [phone] y presione la opcin 4 o envenos un mensaje a travs de Clinical cytogeneticist.   No podemos decirle cul ser su copago por los medicamentos por adelantado ya que esto es diferente dependiendo de la cobertura de su seguro. Sin embargo, es posible que podamos encontrar un medicamento sustituto a Audiological scientist un formulario para que el seguro cubra el medicamento que se considera necesario.   Si se requiere una autorizacin previa para que su compaa de seguros [place] su medicamento, por favor permtanos de 1  a 2 das hbiles para completar [address]  Los precios de los medicamentos varan con frecuencia dependiendo del Environmental consultant de dnde se surte la receta y alguna farmacias pueden ofrecer precios ms baratos.  El sitio web www.goodrx.com tiene cupones para medicamentos de Health and safety inspector. Los precios aqu no tienen en cuenta lo que podra costar con la ayuda del seguro (puede ser ms barato con su seguro), pero el sitio web puede darle el precio si no utiliz Tourist information centre manager.  - Puede imprimir el cupn correspondiente y llevarlo con su receta a la farmacia.  - Tambin puede pasar por nuestra oficina durante el horario de atencin regular y Education officer, museum una tarjeta de cupones de GoodRx.  - Si necesita que su receta se enve electrnicamente a una farmacia diferente, informe a nuestra oficina a travs de MyChart de [place] o por telfono llamando al [phone] y presione la  opcin 4.

## 2023-03-24 NOTE — Progress Notes (Signed)
Follow-Up Visit   Subjective  Lawrence Bush is a 89 y.o. male who presents for the following: Psoriasis. On Cosentyx for years. Tolerating well. Denies side effects, adverse reactions or injection site reactions. Cleared completely this past June so he stopped Cosentyx for a few months but psoriasis started coming back on R lower leg. Started back up on med in September with improvement. Still has active area on right leg.   Check scaly spot on left cheek. Dur: ~6 months. Tender and irritated  The following portions of the chart were reviewed this encounter and updated as appropriate: medications, allergies, medical history  Review of Systems:  No other skin or systemic complaints except as noted in HPI or Assessment and Plan.  Objective  Well appearing patient in no apparent distress; mood and affect are within normal limits.  Areas Examined: Face, arms, legs  Relevant exam findings are noted in the Assessment and Plan.    Left medial cheek x3 (3) Erythematous keratotic or waxy stuck-on papule  nasal tip x1 Keratotic macule    Assessment & Plan   Psoriasis  Related Procedures QuantiFERON-TB Gold Plus  Related Medications Secukinumab, 300 MG Dose, (COSENTYX SENSOREADY, 300 MG,) 150 MG/ML SOAJ Inject 300 mg into the skin every 28 (twenty-eight) days. For maintenance.  tacrolimus (PROTOPIC) 0.1 % ointment Apply topically 2 (two) times daily. Apply to affected areas of psoriasis 1-2 times daily as needed.  Encounter for long-term (current) use of high-risk medication  Related Procedures QuantiFERON-TB Gold Plus  Inflamed seborrheic keratosis (3) Left medial cheek x3  Vs hypertrophic AKs  Symptomatic, irritating, patient would like treated.  Recheck on follow up.   Destruction of lesion - Left medial cheek x3 (3)  Destruction method: cryotherapy   Informed consent: discussed and consent obtained   Lesion destroyed using liquid nitrogen: Yes   Region frozen  until ice ball extended beyond lesion: Yes   Outcome: patient tolerated procedure well with no complications   Post-procedure details: wound care instructions given   Additional details:  Prior to procedure, discussed risks of blister formation, small wound, skin dyspigmentation, or rare scar following cryotherapy. Recommend Vaseline ointment to treated areas while healing.   AK (actinic keratosis) nasal tip x1  Actinic keratoses are precancerous spots that appear secondary to cumulative UV radiation exposure/sun exposure over time. They are chronic with expected duration over 1 year. A portion of actinic keratoses will progress to squamous cell carcinoma of the skin. It is not possible to reliably predict which spots will progress to skin cancer and so treatment is recommended to prevent development of skin cancer.  Recommend daily broad spectrum sunscreen SPF 30+ to sun-exposed areas, reapply every 2 hours as needed.  Recommend staying in the shade or wearing long sleeves, sun glasses (UVA+UVB protection) and wide brim hats (4-inch brim around the entire circumference of the hat). Call for new or changing lesions.  Destruction of lesion - nasal tip x1  Destruction method: cryotherapy   Informed consent: discussed and consent obtained   Lesion destroyed using liquid nitrogen: Yes   Region frozen until ice ball extended beyond lesion: Yes   Outcome: patient tolerated procedure well with no complications   Post-procedure details: wound care instructions given   Additional details:  Prior to procedure, discussed risks of blister formation, small wound, skin dyspigmentation, or rare scar following cryotherapy. Recommend Vaseline ointment to treated areas while healing.   HISTORY OF BASAL CELL CARCINOMA OF THE SKIN - No evidence of recurrence  today Nose (s/p Mohs 2/24) - Recommend regular full body skin exams - Recommend daily broad spectrum sunscreen SPF 30+ to sun-exposed areas, reapply  every 2 hours as needed.  - Call if any new or changing lesions are noted between office visits   PSORIASIS on systemic treatment Pink scaly plaques with silvery scale, at right lower leg. Scattered pink papules at right leg 2% BSA on Cosentyx  Chronic and persistent condition with duration or expected duration over one year. Condition is improving with treatment but not currently at goal.  Pt stopped Cosentyx and had flare, and now improving back on it, but not clear.   Counseling and coordination of care for severe psoriasis on systemic treatment  Psoriasis - severe on systemic treatment.  Psoriasis is a chronic non-curable, but treatable genetic/hereditary disease that may have other systemic features affecting other organ systems such as joints (Psoriatic Arthritis).  It is linked with heart disease, inflammatory bowel disease, non-alcoholic fatty liver disease, and depression. Significant skin psoriasis and/or psoriatic arthritis may have significant symptoms and affects activities of daily activity and often benefits from systemic treatments.  These systemic treatments have some potential side effects including immunosuppression and require pre-treatment laboratory screening and periodic laboratory monitoring and periodic in person evaluation and monitoring by the attending dermatologist physician (long term medication management).   Patient denies joint pain  Treatment Plan: Check Quant-Gold TB screen.  Pt will have added to next blood draw (being followed for anemia).  Labs reviewed 12/29/22 CMP, 03/16/23 CBC/diff (Hb 8.4) Continue Cosentyx 300mg  every 28 days. Pt is reapplying for pt assistance for the new year Continue tacrolimus 0.1% ointment once daily when using with Clobetasol cream. Can use once or twice daily when not using Clobetasol. Start Clobetasol cream once daily for 2 weeks to aas lower leg then discontinue. Use as needed for flares.   Cosentyx sample given to patient today  to inject himself at home.  NDC: 9811-9147-82 Lot: [id number] Exp: 06/10/2024  Reviewed risks of biologics including immunosuppression, infections, injection site reaction, and failure to improve condition. Goal is control of skin condition, not cure.  Some older biologics such as Humira and Enbrel may slightly increase risk of malignancy and may worsen congestive heart failure.  Taltz and Cosentyx may cause inflammatory bowel disease to flare. The use of biologics requires long term medication management, including periodic office visits and monitoring of blood work.   Long term medication management.  Patient is using long term (months to years) prescription medication  to control their dermatologic condition.  These medications require periodic monitoring to evaluate for efficacy and side effects and may require periodic laboratory monitoring.   Return in about 6 months (around 09/22/2023) for Psoriasis Follow Up.  I, Lawson Radar, CMA, am acting as scribe for Willeen Niece, MD.   Documentation: I have reviewed the above documentation for accuracy and completeness, and I agree with the above.  Willeen Niece, MD

## 2023-03-29 ENCOUNTER — Telehealth: Payer: Self-pay

## 2023-03-29 MED ORDER — COSENTYX UNOREADY 300 MG/2ML ~~LOC~~ SOAJ: 300.0000 mg | SUBCUTANEOUS | 5 refills | Status: AC

## 2023-03-29 NOTE — Telephone Encounter (Signed)
Refills requested from [employer] patient assistance. aw

## 2023-09-28 ENCOUNTER — Ambulatory Visit: Payer: Medicare HMO | Admitting: Dermatology
